# Patient Record
Sex: Female | Born: 1935 | Race: White | Hispanic: No | Smoking: Never smoker
Health system: Southern US, Community
[De-identification: ages and names within clinical notes are randomized; demographics above are authoritative.]

## PROBLEM LIST (undated history)

## (undated) DIAGNOSIS — M48062 Spinal stenosis, lumbar region with neurogenic claudication: Secondary | ICD-10-CM

## (undated) DIAGNOSIS — E785 Hyperlipidemia, unspecified: Secondary | ICD-10-CM

## (undated) DIAGNOSIS — R9431 Abnormal electrocardiogram [ECG] [EKG]: Secondary | ICD-10-CM

## (undated) DIAGNOSIS — G500 Trigeminal neuralgia: Secondary | ICD-10-CM

## (undated) DIAGNOSIS — M48061 Spinal stenosis, lumbar region without neurogenic claudication: Secondary | ICD-10-CM

## (undated) DIAGNOSIS — D696 Thrombocytopenia, unspecified: Secondary | ICD-10-CM

## (undated) DIAGNOSIS — R490 Dysphonia: Secondary | ICD-10-CM

## (undated) DIAGNOSIS — Z789 Other specified health status: Secondary | ICD-10-CM

## (undated) DIAGNOSIS — R0789 Other chest pain: Secondary | ICD-10-CM

## (undated) DIAGNOSIS — M8580 Other specified disorders of bone density and structure, unspecified site: Secondary | ICD-10-CM

## (undated) DIAGNOSIS — M5412 Radiculopathy, cervical region: Secondary | ICD-10-CM

## (undated) DIAGNOSIS — I10 Essential (primary) hypertension: Secondary | ICD-10-CM

## (undated) DIAGNOSIS — M1990 Unspecified osteoarthritis, unspecified site: Secondary | ICD-10-CM

## (undated) DIAGNOSIS — R112 Nausea with vomiting, unspecified: Secondary | ICD-10-CM

## (undated) DIAGNOSIS — D692 Other nonthrombocytopenic purpura: Secondary | ICD-10-CM

## (undated) DIAGNOSIS — Z9889 Other specified postprocedural states: Secondary | ICD-10-CM

## (undated) DIAGNOSIS — E0590 Thyrotoxicosis, unspecified without thyrotoxic crisis or storm: Secondary | ICD-10-CM

## (undated) DIAGNOSIS — R930 Abnormal findings on diagnostic imaging of skull and head, not elsewhere classified: Secondary | ICD-10-CM

## (undated) DIAGNOSIS — E538 Deficiency of other specified B group vitamins: Secondary | ICD-10-CM

## (undated) DIAGNOSIS — I4510 Unspecified right bundle-branch block: Secondary | ICD-10-CM

## (undated) DIAGNOSIS — E042 Nontoxic multinodular goiter: Secondary | ICD-10-CM

## (undated) HISTORY — DX: Deficiency of other specified B group vitamins: E53.8

## (undated) HISTORY — PX: VERTEBROPLASTY: SHX113

## (undated) HISTORY — DX: Abnormal electrocardiogram (ECG) (EKG): R94.31

## (undated) HISTORY — DX: Unspecified osteoarthritis, unspecified site: M19.90

## (undated) HISTORY — DX: Dysphonia: R49.0

## (undated) HISTORY — DX: Thrombocytopenia, unspecified: D69.6

## (undated) HISTORY — DX: Spinal stenosis, lumbar region without neurogenic claudication: M48.061

## (undated) HISTORY — DX: Hyperlipidemia, unspecified: E78.5

## (undated) HISTORY — DX: Thyrotoxicosis, unspecified without thyrotoxic crisis or storm: E05.90

## (undated) HISTORY — DX: Other nonthrombocytopenic purpura: D69.2

## (undated) HISTORY — PX: BACK SURGERY: SHX140

## (undated) HISTORY — DX: Other chest pain: R07.89

## (undated) HISTORY — DX: Radiculopathy, cervical region: M54.12

## (undated) HISTORY — DX: Essential (primary) hypertension: I10

## (undated) HISTORY — PX: CATARACT EXTRACTION W/ INTRAOCULAR LENS  IMPLANT, BILATERAL: SHX1307

## (undated) HISTORY — DX: Unspecified right bundle-branch block: I45.10

## (undated) HISTORY — DX: Abnormal findings on diagnostic imaging of skull and head, not elsewhere classified: R93.0

## (undated) HISTORY — DX: Trigeminal neuralgia: G50.0

## (undated) HISTORY — DX: Nontoxic multinodular goiter: E04.2

## (undated) HISTORY — DX: Spinal stenosis, lumbar region with neurogenic claudication: M48.062

## (undated) HISTORY — DX: Other specified disorders of bone density and structure, unspecified site: M85.80

---

## 2002-02-18 ENCOUNTER — Inpatient Hospital Stay (HOSPITAL_COMMUNITY): Admission: EM | Admit: 2002-02-18 | Discharge: 2002-02-21 | Payer: Self-pay | Admitting: Emergency Medicine

## 2002-02-18 ENCOUNTER — Encounter: Payer: Self-pay | Admitting: Emergency Medicine

## 2002-02-19 ENCOUNTER — Encounter: Payer: Self-pay | Admitting: Orthopedic Surgery

## 2002-06-19 ENCOUNTER — Encounter: Payer: Self-pay | Admitting: Orthopedic Surgery

## 2002-06-24 ENCOUNTER — Encounter: Payer: Self-pay | Admitting: Orthopedic Surgery

## 2002-06-24 ENCOUNTER — Ambulatory Visit (HOSPITAL_COMMUNITY): Admission: RE | Admit: 2002-06-24 | Discharge: 2002-06-25 | Payer: Self-pay | Admitting: Orthopedic Surgery

## 2004-06-07 ENCOUNTER — Encounter: Admission: RE | Admit: 2004-06-07 | Discharge: 2004-06-07 | Payer: Self-pay | Admitting: Orthopedic Surgery

## 2010-05-29 ENCOUNTER — Encounter: Payer: Self-pay | Admitting: Orthopedic Surgery

## 2012-09-20 ENCOUNTER — Ambulatory Visit
Admission: RE | Admit: 2012-09-20 | Discharge: 2012-09-20 | Disposition: A | Discharge status: Home / Self Care | Payer: Medicare Other | Source: Ambulatory Visit | Attending: Orthopaedic Surgery | Admitting: Orthopaedic Surgery

## 2012-09-20 ENCOUNTER — Other Ambulatory Visit: Payer: Self-pay | Admitting: Orthopaedic Surgery

## 2012-09-20 DIAGNOSIS — M545 Low back pain: Secondary | ICD-10-CM

## 2012-10-17 ENCOUNTER — Other Ambulatory Visit: Payer: Self-pay | Admitting: Orthopaedic Surgery

## 2012-10-17 DIAGNOSIS — M545 Low back pain: Secondary | ICD-10-CM

## 2012-10-17 DIAGNOSIS — M25551 Pain in right hip: Secondary | ICD-10-CM

## 2012-10-18 ENCOUNTER — Ambulatory Visit
Admission: RE | Admit: 2012-10-18 | Discharge: 2012-10-18 | Disposition: A | Discharge status: Home / Self Care | Payer: Medicare Other | Source: Ambulatory Visit | Attending: Orthopaedic Surgery | Admitting: Orthopaedic Surgery

## 2012-10-18 DIAGNOSIS — M25551 Pain in right hip: Secondary | ICD-10-CM

## 2012-11-06 ENCOUNTER — Other Ambulatory Visit (HOSPITAL_COMMUNITY): Payer: Self-pay | Admitting: Specialist

## 2012-11-06 DIAGNOSIS — R102 Pelvic and perineal pain: Secondary | ICD-10-CM

## 2012-11-06 DIAGNOSIS — M25551 Pain in right hip: Secondary | ICD-10-CM

## 2012-11-13 ENCOUNTER — Encounter (HOSPITAL_COMMUNITY): Payer: Medicare Other

## 2012-11-13 ENCOUNTER — Ambulatory Visit (HOSPITAL_COMMUNITY): Payer: Medicare Other

## 2013-02-01 ENCOUNTER — Encounter (HOSPITAL_COMMUNITY): Payer: Self-pay | Admitting: Pharmacy Technician

## 2013-02-04 ENCOUNTER — Other Ambulatory Visit (HOSPITAL_COMMUNITY): Payer: Self-pay | Admitting: Specialist

## 2013-02-06 ENCOUNTER — Encounter (HOSPITAL_COMMUNITY)
Admission: RE | Admit: 2013-02-06 | Discharge: 2013-02-06 | Disposition: A | Discharge status: Home / Self Care | Payer: Medicare Other | Source: Ambulatory Visit | Attending: Specialist | Admitting: Specialist

## 2013-02-06 ENCOUNTER — Encounter (HOSPITAL_COMMUNITY): Payer: Self-pay

## 2013-02-06 DIAGNOSIS — Z01812 Encounter for preprocedural laboratory examination: Secondary | ICD-10-CM | POA: Diagnosis not present

## 2013-02-06 DIAGNOSIS — Z79899 Other long term (current) drug therapy: Secondary | ICD-10-CM

## 2013-02-06 DIAGNOSIS — Z7982 Long term (current) use of aspirin: Secondary | ICD-10-CM | POA: Diagnosis not present

## 2013-02-06 DIAGNOSIS — M79609 Pain in unspecified limb: Secondary | ICD-10-CM | POA: Diagnosis present

## 2013-02-06 DIAGNOSIS — M48062 Spinal stenosis, lumbar region with neurogenic claudication: Secondary | ICD-10-CM | POA: Diagnosis not present

## 2013-02-06 HISTORY — DX: Other specified postprocedural states: Z98.890

## 2013-02-06 HISTORY — DX: Other specified health status: Z78.9

## 2013-02-06 HISTORY — DX: Nausea with vomiting, unspecified: R11.2

## 2013-02-06 LAB — COMPREHENSIVE METABOLIC PANEL
ALT: 14 U/L (ref 0–35)
AST: 26 U/L (ref 0–37)
Alkaline Phosphatase: 80 U/L (ref 39–117)
CO2: 28 mEq/L (ref 19–32)
Chloride: 105 mEq/L (ref 96–112)
GFR calc non Af Amer: 86 mL/min — ABNORMAL LOW (ref >90)
Potassium: 4.3 mEq/L (ref 3.5–5.1)
Sodium: 144 mEq/L (ref 135–145)
Total Bilirubin: 0.5 mg/dL (ref 0.3–1.2)
Total Protein: 7 g/dL (ref 6.0–8.3)

## 2013-02-06 LAB — CBC
MCV: 90 fL (ref 78.0–100.0)
Platelets: 160 10*3/uL (ref 150–400)
RBC: 4.4 MIL/uL (ref 3.87–5.11)
WBC: 7 10*3/uL (ref 4.0–10.5)

## 2013-02-06 NOTE — Pre-Procedure Instructions (Signed)
Maureen Jensen  02/06/2013   Your procedure is scheduled on:  Friday  02/08/13    Report to [facility]  2 * 3 at 1030 AM.  Call this number if you have problems the morning of surgery: [phone]   Remember:   Do not eat food or drink liquids after midnight.   Take these medicines the morning of surgery with A SIP OF WATER:  NONE   Do not wear jewelry, make-up or nail polish.  Do not wear lotions, powders, or perfumes. You may wear deodorant.  Do not shave 48 hours prior to surgery. Men may shave face and neck.  Do not bring valuables to the hospital.  [facility] is not responsible                  for any belongings or valuables.               Contacts, dentures or bridgework may not be worn into surgery.  Leave suitcase in the car. After surgery it may be brought to your room.  For patients admitted to the hospital, discharge time is determined by your                treatment team.               Patients discharged the day of surgery will not be allowed to drive  home.  Name and phone number of your driver:   Special Instructions: Shower using CHG 2 nights before surgery and the night before surgery.  If you shower the day of surgery use CHG.  Use special wash - you have one bottle of CHG for all showers.  You should use approximately 1/3 of the bottle for each shower.   Please read over the following fact sheets that you were given: Pain Booklet, Coughing and Deep Breathing, Blood Transfusion Information, MRSA Information and Surgical Site Infection Prevention

## 2013-02-06 NOTE — H&P (Signed)
Maureen Jensen is an 79 y.o. female.   Chief Complaint: back and right leg pain HPI: Pt with chronic and persistent pain in the back, buttocks and right leg related to stenosis of the lumbar spine.  Pain is worse with standing and ambulating and improves with sitting ,bending and stooping.  Remote history of back surgery in 1982 and history of compression fractures treated with vertebroplasty by Dr Noel Gerold.  Pt has had ESI for findings of lumbar spinal stenosis right L3-4, L4-5 and L5-S1 with temporizing relief.    Studies include MRI done at [facility] June 2013 and show lateral recess stenosis right L3-4 , L4-5 and L5-S1 with foraminal entrapment occurring at both L4-5 and L5-S1.  EMG/NCV indicated and L5 radiculopathy.  Pt wishes to proceed with surgical intervention and will undergo hemilaminectomy L3-4 and L4-5 with foraminotomy right L3, L4 and L5 using MIS technique.   Past Medical History  Diagnosis Date  . PONV (postoperative nausea and vomiting)   . Medical history non-contributory     Past Surgical History  Procedure Laterality Date  . Back surgery      1983  . Vertebroplasty    . Cataract extraction w/ intraocular lens  implant, bilateral      History reviewed. No pertinent family history. Social History:  reports that she has never smoked. She does not have any smokeless tobacco history on file. She reports that she does not drink alcohol or use illicit drugs.  Allergies: No Known Allergies  Medications Prior to Admission  Medication Sig Dispense Refill  . aspirin 81 MG tablet Take 81 mg by mouth daily.      . Cholecalciferol (VITAMIN D3) 5000 UNITS CAPS Take 1 capsule by mouth daily.      Marland Kitchen gabapentin (NEURONTIN) 100 MG capsule Take 100 mg by mouth at bedtime.        Results for orders placed during the hospital encounter of 02/06/13 (from the past 48 hour(s))  CBC     Status: None   Collection Time    02/06/13  2:20 PM      Result Value Range   WBC 7.0   4.0 - 10.5 K/uL   RBC 4.40  3.87 - 5.11 MIL/uL   Hemoglobin 13.8  12.0 - 15.0 g/dL   HCT 19.1  47.8 - 29.5 %   MCV 90.0  78.0 - 100.0 fL   MCH 31.4  26.0 - 34.0 pg   MCHC 34.8  30.0 - 36.0 g/dL   RDW 62.1  30.8 - 65.7 %   Platelets 160  150 - 400 K/uL  COMPREHENSIVE METABOLIC PANEL     Status: Abnormal   Collection Time    02/06/13  2:20 PM      Result Value Range   Sodium 144  135 - 145 mEq/L   Potassium 4.3  3.5 - 5.1 mEq/L   Chloride 105  96 - 112 mEq/L   CO2 28  19 - 32 mEq/L   Glucose, Bld 93  70 - 99 mg/dL   BUN 8  6 - 23 mg/dL   Creatinine, Ser 8.46  0.50 - 1.10 mg/dL   Calcium 9.2  8.4 - 96.2 mg/dL   Total Protein 7.0  6.0 - 8.3 g/dL   Albumin 4.2  3.5 - 5.2 g/dL   AST 26  0 - 37 U/L   ALT 14  0 - 35 U/L   Alkaline Phosphatase 80  39 - 117 U/L   Total  Bilirubin 0.5  0.3 - 1.2 mg/dL   GFR calc non Af Amer 86 (*) >90 mL/min   GFR calc Af Amer >90  >90 mL/min   Comment: (NOTE)     The eGFR has been calculated using the CKD EPI equation.     This calculation has not been validated in all clinical situations.     eGFR's persistently <90 mL/min signify possible Chronic Kidney     Disease.  SURGICAL PCR SCREEN     Status: None   Collection Time    02/06/13  2:24 PM      Result Value Range   MRSA, PCR NEGATIVE  NEGATIVE   Staphylococcus aureus NEGATIVE  NEGATIVE   Comment:            The Xpert SA Assay (FDA     approved for NASAL specimens     in patients over 59 years of age),     is one component of     a comprehensive surveillance     program.  Test performance has     been validated by [employer] for patients greater     than or equal to 22 year old.     It is not intended     to diagnose infection nor to     guide or monitor treatment.   No results found.  Review of Systems  Constitutional: Negative.   HENT: Negative.   Eyes: Negative.   Respiratory: Negative.   Cardiovascular: Negative.   Gastrointestinal: Negative.   Genitourinary: Negative.    Musculoskeletal:       Cannot stand up straight to walk.  Back pain with activity.    Skin: Negative.   Neurological: Negative.   Endo/Heme/Allergies: Negative.   Psychiatric/Behavioral: Negative.     Blood pressure 177/76, pulse 69, temperature 97.7 F (36.5 C), temperature source Oral, resp. rate 18, SpO2 100.00%. Physical Exam  Constitutional: She is oriented to person, place, and time. She appears well-developed and well-nourished.  HENT:  Head: Normocephalic and atraumatic.  Eyes: EOM are normal. Pupils are equal, round, and reactive to light.  Neck: Normal range of motion.  Cardiovascular: Normal rate, regular rhythm and normal heart sounds.   Respiratory: Effort normal and breath sounds normal.  GI: Soft.  Musculoskeletal:  -SLR bilateral LEs.  Distal pulses intact.  Pain in mid and low back with ROM.  No focal weakness of LEs  Neurological: She is alert and oriented to person, place, and time.  Skin: Skin is warm and dry.  Psychiatric: She has a normal mood and affect.     Assessment/Plan Lateral recess and foraminal stenosis right L3-4, L4-5 and L5-S1  PLAN:  Hemilaminectomy L3-4 and L4-5. Foraminotomy right L3, L4,and L5. Patient was seen and examined in the preop holding area. There has been no interval  Change in this patient's exam preop  history and physical exam  Lab tests and images have been examined and reviewed.  The Risks benefits and alternative treatments have been discussed  extensively,questions answered.  The patient has elected to undergo the discussed surgical treatment.  Jazalyn Mondor E 02/08/2013, 12:18 PM

## 2013-02-07 MED ORDER — CEFAZOLIN SODIUM-DEXTROSE 2-3 GM-% IV SOLR
2.0000 g | INTRAVENOUS | Status: AC
  Administered 2013-02-08: 2 g via INTRAVENOUS
  Filled 2013-02-07: qty 50

## 2013-02-08 ENCOUNTER — Inpatient Hospital Stay (HOSPITAL_COMMUNITY): Payer: Medicare Other | Admitting: Anesthesiology

## 2013-02-08 ENCOUNTER — Inpatient Hospital Stay (HOSPITAL_COMMUNITY): Payer: Medicare Other

## 2013-02-08 ENCOUNTER — Encounter (HOSPITAL_COMMUNITY): Payer: Self-pay | Admitting: Anesthesiology

## 2013-02-08 ENCOUNTER — Inpatient Hospital Stay (HOSPITAL_COMMUNITY)
Admission: RE | Admit: 2013-02-08 | Discharge: 2013-02-09 | DRG: 520 | Disposition: A | Discharge status: Home / Self Care | Payer: Medicare Other | Source: Ambulatory Visit | Attending: Specialist | Admitting: Specialist

## 2013-02-08 ENCOUNTER — Encounter (HOSPITAL_COMMUNITY)
Admission: RE | Disposition: A | Discharge status: Home / Self Care | Payer: Self-pay | Source: Ambulatory Visit | Attending: Specialist

## 2013-02-08 ENCOUNTER — Encounter (HOSPITAL_COMMUNITY): Payer: Self-pay | Admitting: *Deleted

## 2013-02-08 DIAGNOSIS — M48062 Spinal stenosis, lumbar region with neurogenic claudication: Secondary | ICD-10-CM

## 2013-02-08 DIAGNOSIS — Z7982 Long term (current) use of aspirin: Secondary | ICD-10-CM | POA: Diagnosis not present

## 2013-02-08 DIAGNOSIS — Z79899 Other long term (current) drug therapy: Secondary | ICD-10-CM | POA: Diagnosis not present

## 2013-02-08 DIAGNOSIS — Z01812 Encounter for preprocedural laboratory examination: Secondary | ICD-10-CM | POA: Diagnosis not present

## 2013-02-08 DIAGNOSIS — M48061 Spinal stenosis, lumbar region without neurogenic claudication: Secondary | ICD-10-CM

## 2013-02-08 DIAGNOSIS — M79609 Pain in unspecified limb: Secondary | ICD-10-CM | POA: Diagnosis present

## 2013-02-08 HISTORY — PX: LUMBAR LAMINECTOMY: SHX95

## 2013-02-08 HISTORY — DX: Spinal stenosis, lumbar region with neurogenic claudication: M48.062

## 2013-02-08 HISTORY — DX: Spinal stenosis, lumbar region without neurogenic claudication: M48.061

## 2013-02-08 SURGERY — MICRODISCECTOMY LUMBAR LAMINECTOMY
Anesthesia: General | Site: Back | Laterality: Right | Wound class: Clean

## 2013-02-08 MED ORDER — DEXAMETHASONE SODIUM PHOSPHATE 10 MG/ML IJ SOLN
10.0000 mg | Freq: Once | INTRAMUSCULAR | Status: AC
  Administered 2013-02-08: 10 mg via INTRAVENOUS

## 2013-02-08 MED ORDER — ASPIRIN 81 MG PO TABS: 81.0000 mg | ORAL_TABLET | Freq: Every day | ORAL | Status: DC

## 2013-02-08 MED ORDER — MORPHINE SULFATE 2 MG/ML IJ SOLN: 1.0000 mg | INTRAMUSCULAR | Status: DC | PRN

## 2013-02-08 MED ORDER — PHENOL 1.4 % MT LIQD: 1.0000 | OROMUCOSAL | Status: DC | PRN

## 2013-02-08 MED ORDER — LACTATED RINGERS IV SOLN
INTRAVENOUS | Status: DC
  Administered 2013-02-08: 11:00:00 via INTRAVENOUS

## 2013-02-08 MED ORDER — LACTATED RINGERS IV SOLN
INTRAVENOUS | Status: DC | PRN
  Administered 2013-02-08 (×2): via INTRAVENOUS

## 2013-02-08 MED ORDER — THROMBIN 20000 UNITS EX SOLR
CUTANEOUS | Status: AC
  Filled 2013-02-08: qty 20000

## 2013-02-08 MED ORDER — GLYCOPYRROLATE 0.2 MG/ML IJ SOLN
INTRAMUSCULAR | Status: DC | PRN
  Administered 2013-02-08: 0.4 mg via INTRAVENOUS

## 2013-02-08 MED ORDER — GABAPENTIN 100 MG PO CAPS
100.0000 mg | ORAL_CAPSULE | Freq: Every day | ORAL | Status: DC
  Administered 2013-02-08: 100 mg via ORAL
  Filled 2013-02-08 (×3): qty 1

## 2013-02-08 MED ORDER — OXYCODONE-ACETAMINOPHEN 5-325 MG PO TABS
1.0000 | ORAL_TABLET | ORAL | Status: DC | PRN
  Administered 2013-02-08 – 2013-02-09 (×2): 1 via ORAL
  Filled 2013-02-08 (×2): qty 1

## 2013-02-08 MED ORDER — BUPIVACAINE-EPINEPHRINE (PF) 0.5% -1:200000 IJ SOLN
INTRAMUSCULAR | Status: AC
  Filled 2013-02-08: qty 10

## 2013-02-08 MED ORDER — METHOCARBAMOL 500 MG PO TABS
500.0000 mg | ORAL_TABLET | Freq: Four times a day (QID) | ORAL | Status: DC | PRN
  Administered 2013-02-08 – 2013-02-09 (×2): 500 mg via ORAL
  Filled 2013-02-08 (×3): qty 1

## 2013-02-08 MED ORDER — HYDROMORPHONE HCL PF 1 MG/ML IJ SOLN: 0.2500 mg | INTRAMUSCULAR | Status: DC | PRN

## 2013-02-08 MED ORDER — OXYCODONE-ACETAMINOPHEN 5-325 MG PO TABS: 1.0000 | ORAL_TABLET | ORAL | Status: DC | PRN

## 2013-02-08 MED ORDER — THROMBIN 20000 UNITS EX SOLR
OROMUCOSAL | Status: DC | PRN
  Administered 2013-02-08: 14:00:00 via TOPICAL

## 2013-02-08 MED ORDER — FLEET ENEMA 7-19 GM/118ML RE ENEM: 1.0000 | ENEMA | Freq: Once | RECTAL | Status: AC | PRN

## 2013-02-08 MED ORDER — BISACODYL 10 MG RE SUPP: 10.0000 mg | Freq: Every day | RECTAL | Status: DC | PRN

## 2013-02-08 MED ORDER — 0.9 % SODIUM CHLORIDE (POUR BTL) OPTIME
TOPICAL | Status: DC | PRN
  Administered 2013-02-08: 1000 mL

## 2013-02-08 MED ORDER — ACETAMINOPHEN 10 MG/ML IV SOLN
INTRAVENOUS | Status: DC | PRN
  Administered 2013-02-08: 1000 mg via INTRAVENOUS

## 2013-02-08 MED ORDER — BUPIVACAINE-EPINEPHRINE 0.5% -1:200000 IJ SOLN
INTRAMUSCULAR | Status: DC | PRN
  Administered 2013-02-08: 10 mL

## 2013-02-08 MED ORDER — SODIUM CHLORIDE 0.9 % IJ SOLN: 3.0000 mL | Freq: Two times a day (BID) | INTRAMUSCULAR | Status: DC

## 2013-02-08 MED ORDER — ACETAMINOPHEN 10 MG/ML IV SOLN
INTRAVENOUS | Status: AC
  Filled 2013-02-08: qty 100

## 2013-02-08 MED ORDER — LIDOCAINE HCL (CARDIAC) 20 MG/ML IV SOLN
INTRAVENOUS | Status: DC | PRN
  Administered 2013-02-08: 50 mg via INTRAVENOUS

## 2013-02-08 MED ORDER — CEFAZOLIN SODIUM 1-5 GM-% IV SOLN
1.0000 g | Freq: Three times a day (TID) | INTRAVENOUS | Status: AC
  Administered 2013-02-08 – 2013-02-09 (×2): 1 g via INTRAVENOUS
  Filled 2013-02-08 (×2): qty 50

## 2013-02-08 MED ORDER — DOCUSATE SODIUM 100 MG PO CAPS
100.0000 mg | ORAL_CAPSULE | Freq: Two times a day (BID) | ORAL | Status: DC
  Administered 2013-02-08 – 2013-02-09 (×2): 100 mg via ORAL
  Filled 2013-02-08 (×3): qty 1

## 2013-02-08 MED ORDER — ACETAMINOPHEN 650 MG RE SUPP: 650.0000 mg | RECTAL | Status: DC | PRN

## 2013-02-08 MED ORDER — KCL IN DEXTROSE-NACL 20-5-0.45 MEQ/L-%-% IV SOLN
INTRAVENOUS | Status: DC
  Filled 2013-02-08 (×4): qty 1000

## 2013-02-08 MED ORDER — SODIUM CHLORIDE 0.9 % IV SOLN: 250.0000 mL | INTRAVENOUS | Status: DC

## 2013-02-08 MED ORDER — ONDANSETRON HCL 4 MG/2ML IJ SOLN
INTRAMUSCULAR | Status: DC | PRN
  Administered 2013-02-08: 4 mg via INTRAVENOUS

## 2013-02-08 MED ORDER — ONDANSETRON HCL 4 MG/2ML IJ SOLN: 4.0000 mg | INTRAMUSCULAR | Status: DC | PRN

## 2013-02-08 MED ORDER — METHOCARBAMOL 100 MG/ML IJ SOLN
500.0000 mg | Freq: Four times a day (QID) | INTRAVENOUS | Status: DC | PRN
  Filled 2013-02-08: qty 5

## 2013-02-08 MED ORDER — MENTHOL 3 MG MT LOZG: 1.0000 | LOZENGE | OROMUCOSAL | Status: DC | PRN

## 2013-02-08 MED ORDER — PANTOPRAZOLE SODIUM 40 MG IV SOLR
40.0000 mg | Freq: Every day | INTRAVENOUS | Status: DC
  Administered 2013-02-08: 40 mg via INTRAVENOUS
  Filled 2013-02-08 (×2): qty 40

## 2013-02-08 MED ORDER — CHLORHEXIDINE GLUCONATE 4 % EX LIQD: 60.0000 mL | Freq: Once | CUTANEOUS | Status: DC

## 2013-02-08 MED ORDER — HYDROCODONE-ACETAMINOPHEN 5-325 MG PO TABS: 1.0000 | ORAL_TABLET | ORAL | Status: DC | PRN

## 2013-02-08 MED ORDER — FENTANYL CITRATE 0.05 MG/ML IJ SOLN
INTRAMUSCULAR | Status: DC | PRN
  Administered 2013-02-08: 100 ug via INTRAVENOUS
  Administered 2013-02-08: 50 ug via INTRAVENOUS
  Administered 2013-02-08: 100 ug via INTRAVENOUS

## 2013-02-08 MED ORDER — ALUM & MAG HYDROXIDE-SIMETH 200-200-20 MG/5ML PO SUSP: 30.0000 mL | Freq: Four times a day (QID) | ORAL | Status: DC | PRN

## 2013-02-08 MED ORDER — MIDAZOLAM HCL 5 MG/5ML IJ SOLN
INTRAMUSCULAR | Status: DC | PRN
  Administered 2013-02-08: 1 mg via INTRAVENOUS

## 2013-02-08 MED ORDER — ACETAMINOPHEN 325 MG PO TABS: 650.0000 mg | ORAL_TABLET | ORAL | Status: DC | PRN

## 2013-02-08 MED ORDER — ROCURONIUM BROMIDE 100 MG/10ML IV SOLN
INTRAVENOUS | Status: DC | PRN
  Administered 2013-02-08: 50 mg via INTRAVENOUS

## 2013-02-08 MED ORDER — SENNOSIDES-DOCUSATE SODIUM 8.6-50 MG PO TABS: 1.0000 | ORAL_TABLET | Freq: Every evening | ORAL | Status: DC | PRN

## 2013-02-08 MED ORDER — KETOROLAC TROMETHAMINE 30 MG/ML IJ SOLN
15.0000 mg | Freq: Three times a day (TID) | INTRAMUSCULAR | Status: DC
  Administered 2013-02-08 – 2013-02-09 (×2): 15 mg via INTRAVENOUS
  Filled 2013-02-08 (×3): qty 1

## 2013-02-08 MED ORDER — SODIUM CHLORIDE 0.9 % IJ SOLN: 3.0000 mL | INTRAMUSCULAR | Status: DC | PRN

## 2013-02-08 MED ORDER — METHOCARBAMOL 500 MG PO TABS: 500.0000 mg | ORAL_TABLET | Freq: Four times a day (QID) | ORAL | Status: DC | PRN

## 2013-02-08 MED ORDER — VITAMIN D3 25 MCG (1000 UNIT) PO TABS
5000.0000 [IU] | ORAL_TABLET | Freq: Every day | ORAL | Status: DC
  Administered 2013-02-09: 5000 [IU] via ORAL
  Filled 2013-02-08: qty 5

## 2013-02-08 MED ORDER — PROPOFOL 10 MG/ML IV BOLUS
INTRAVENOUS | Status: DC | PRN
  Administered 2013-02-08: 100 mg via INTRAVENOUS

## 2013-02-08 MED ORDER — ZOLPIDEM TARTRATE 5 MG PO TABS: 5.0000 mg | ORAL_TABLET | Freq: Every evening | ORAL | Status: DC | PRN

## 2013-02-08 MED ORDER — ASPIRIN EC 81 MG PO TBEC
81.0000 mg | DELAYED_RELEASE_TABLET | Freq: Every day | ORAL | Status: DC
  Administered 2013-02-09: 81 mg via ORAL
  Filled 2013-02-08: qty 1

## 2013-02-08 MED ORDER — NEOSTIGMINE METHYLSULFATE 1 MG/ML IJ SOLN
INTRAMUSCULAR | Status: DC | PRN
  Administered 2013-02-08: 3 mg via INTRAVENOUS

## 2013-02-08 MED ORDER — VITAMIN D3 125 MCG (5000 UT) PO CAPS: 1.0000 | ORAL_CAPSULE | Freq: Every day | ORAL | Status: DC

## 2013-02-08 SURGICAL SUPPLY — 54 items
BUR RND FLUTED 2.5 (BURR) IMPLANT
BUR ROUND FLUTED 4 SOFT TCH (BURR) IMPLANT
BUR SABER RD CUTTING 3.0 (BURR) ×2 IMPLANT
CANISTER SUCTION 2500CC (MISCELLANEOUS) ×2 IMPLANT
CLOTH BEACON ORANGE TIMEOUT ST (SAFETY) ×2 IMPLANT
CORDS BIPOLAR (ELECTRODE) ×2 IMPLANT
COVER MAYO STAND STRL (DRAPES) ×2 IMPLANT
COVER SURGICAL LIGHT HANDLE (MISCELLANEOUS) ×2 IMPLANT
DERMABOND ADVANCED (GAUZE/BANDAGES/DRESSINGS) ×1
DERMABOND ADVANCED .7 DNX12 (GAUZE/BANDAGES/DRESSINGS) ×1 IMPLANT
DRAPE C-ARM 42X72 X-RAY (DRAPES) ×2 IMPLANT
DRAPE MICROSCOPE LEICA (MISCELLANEOUS) ×2 IMPLANT
DRAPE POUCH INSTRU U-SHP 10X18 (DRAPES) ×2 IMPLANT
DRAPE PROXIMA HALF (DRAPES) ×2 IMPLANT
DRAPE SURG 17X23 STRL (DRAPES) ×8 IMPLANT
DRSG MEPILEX BORDER 4X4 (GAUZE/BANDAGES/DRESSINGS) ×2 IMPLANT
DRSG MEPILEX BORDER 4X8 (GAUZE/BANDAGES/DRESSINGS) IMPLANT
DURAPREP 26ML APPLICATOR (WOUND CARE) ×2 IMPLANT
ELECT BLADE 4.0 EZ CLEAN MEGAD (MISCELLANEOUS) ×2
ELECT CAUTERY BLADE 6.4 (BLADE) ×2 IMPLANT
ELECT REM PT RETURN 9FT ADLT (ELECTROSURGICAL) ×2
ELECTRODE BLDE 4.0 EZ CLN MEGD (MISCELLANEOUS) ×1 IMPLANT
ELECTRODE REM PT RTRN 9FT ADLT (ELECTROSURGICAL) ×1 IMPLANT
GLOVE BIOGEL PI IND STRL 7.5 (GLOVE) ×1 IMPLANT
GLOVE BIOGEL PI INDICATOR 7.5 (GLOVE) ×1
GLOVE ECLIPSE 7.0 STRL STRAW (GLOVE) ×2 IMPLANT
GLOVE ECLIPSE 8.5 STRL (GLOVE) ×2 IMPLANT
GLOVE SURG 8.5 LATEX PF (GLOVE) ×2 IMPLANT
GOWN PREVENTION PLUS LG XLONG (DISPOSABLE) ×2 IMPLANT
GOWN PREVENTION PLUS XXLARGE (GOWN DISPOSABLE) ×2 IMPLANT
GOWN STRL NON-REIN LRG LVL3 (GOWN DISPOSABLE) ×4 IMPLANT
KIT BASIN OR (CUSTOM PROCEDURE TRAY) ×2 IMPLANT
KIT ROOM TURNOVER OR (KITS) ×2 IMPLANT
NEEDLE 22X1 1/2 (OR ONLY) (NEEDLE) ×2 IMPLANT
NEEDLE SPNL 18GX3.5 QUINCKE PK (NEEDLE) ×4 IMPLANT
NS IRRIG 1000ML POUR BTL (IV SOLUTION) ×2 IMPLANT
PACK LAMINECTOMY ORTHO (CUSTOM PROCEDURE TRAY) ×2 IMPLANT
PAD ARMBOARD 7.5X6 YLW CONV (MISCELLANEOUS) ×4 IMPLANT
PATTIES SURGICAL .5 X.5 (GAUZE/BANDAGES/DRESSINGS) ×2 IMPLANT
PATTIES SURGICAL .5 X1 (DISPOSABLE) ×2 IMPLANT
PATTIES SURGICAL .75X.75 (GAUZE/BANDAGES/DRESSINGS) IMPLANT
SPONGE LAP 4X18 X RAY DECT (DISPOSABLE) ×4 IMPLANT
SPONGE SURGIFOAM ABS GEL 100 (HEMOSTASIS) ×2 IMPLANT
SUT VIC AB 1 CT1 27 (SUTURE)
SUT VIC AB 1 CT1 27XBRD ANBCTR (SUTURE) IMPLANT
SUT VIC AB 2-0 CT1 27 (SUTURE) ×1
SUT VIC AB 2-0 CT1 TAPERPNT 27 (SUTURE) ×1 IMPLANT
SUT VICRYL 0 UR6 27IN ABS (SUTURE) ×2 IMPLANT
SUT VICRYL 4-0 PS2 18IN ABS (SUTURE) ×2 IMPLANT
SYR CONTROL 10ML LL (SYRINGE) ×2 IMPLANT
TOWEL OR 17X24 6PK STRL BLUE (TOWEL DISPOSABLE) ×2 IMPLANT
TOWEL OR 17X26 10 PK STRL BLUE (TOWEL DISPOSABLE) ×2 IMPLANT
TRAY FOLEY CATH 16FRSI W/METER (SET/KITS/TRAYS/PACK) ×2 IMPLANT
WATER STERILE IRR 1000ML POUR (IV SOLUTION) IMPLANT

## 2013-02-08 NOTE — Transfer of Care (Signed)
Immediate Anesthesia Transfer of Care Note  Patient: Maureen Jensen  Procedure(s) Performed: Procedure(s): Right L3-4, L4-5 Hemi-laminectomy, decompression right L3,L4 and L5 nerve roots (Right)  Patient Location: PACU  Anesthesia Type:General  Level of Consciousness: sedated, patient cooperative and responds to stimulation  Airway & Oxygen Therapy: Patient Spontanous Breathing and Patient connected to nasal cannula oxygen  Post-op Assessment: Report given to PACU RN, Post -op Vital signs reviewed and stable and Patient moving all extremities  Post vital signs: Reviewed and stable  Complications: No apparent anesthesia complications

## 2013-02-08 NOTE — Op Note (Signed)
02/08/2013  2:52 PM  PATIENT:  Maureen Jensen  79 y.o. female  MRN: [id number]  OPERATIVE REPORT  PRE-OPERATIVE DIAGNOSIS:  Right L3-4, L4-5, L5-S1 lateral recess and foraminal stenosis  POST-OPERATIVE DIAGNOSIS:  Right L3-4, L4-5, L5-S1 lateral recess and foraminal stenosis  PROCEDURE:  Procedure(s): Right L3-4, L4-5 Hemi-laminectomy, decompression right L3,L4 and L5 nerve roots MIS with microscope.    SURGEON:  Kerrin Champagne, MD     ASSISTANT:  Maud Deed, PA-C  (Present throughout the entire procedure and necessary for completion of procedure in a timely manner)     ANESTHESIA:  General,supplemented with local anesthesia marcaine 1/2% with 1/200,000 epinephrine.    COMPLICATIONS:  None.     DRAINS: Foley to SD.  EBL: 50cc  PROCEDURE:The patient was met in the holding area, and the appropriate Right Lumbar level L3-4 and L4-5 identified and marked with "x" and my initials.The patient was then transported to OR and was placed under general anesthesia without difficulty. The patient received appropriate preoperative antibiotic prophylaxis. Foley catheter was placed sterilely. The patient after intubation atraumatically was transferred to the operating room table, prone position, Wilson frame, sliding OR table. All pressure points were well padded. The arms in 90-90 well-padded at the elbows. Standard prep with DuraPrep solution lower dorsal spine to the mid sacral segment. Draped in the usual manner iodine Vi-Drape was used. Time-out procedure was called and correct.  Skin  was then infiltrated with Marcaine half percent with 1-200,000 epinephrine total of 10 cc used. Incision was made at the expected L4 level and extended superiorly ellipsing the old incision scar. An incision approximately an inch inch and a half in length was then made through skin and subcutaneous layers in line with the right side of the expected midline.  An incision made into the right lumbosacral fascia  approximately an inch in length . The subcutaneous layers divided and the lumbodorsal fascia incised along the right side of the spinous processes L4 and L3.Cobb elevator used to perform subperioteal exposure of the posterior elements L3 and L4. Successive dilators were then carried up to the 11 mm size. The depth measured off of the dilators at about 60 mm and 60 mm retractors and placed on the scaffolding for the MIS equipment and guided over dilators down to and docking on the posterior aspect of the lamina at the expected L3-4 level. This was sterilely attached to the articulating arm and it's up right which had been attached the OR table sterilely. C-arm fluoroscopy was identified the dilators and the retractors at the appropriate level L3-4.Mervyn Skeeters Penfield #4 inserted between the spinous process of L3 and L4.  C-arm was draped sterilely to the field and used to identify the position of the Penfield#4, it was noted between the spinous process of L3 and that of L4. The operating room microscope sterilely draped brought into the field. Under the operating room microscope, the L4-5 interspace carefully debrided the small amount of muscle attachment here and high-speed bur used to drill the medial aspect of the inferior articular process of L4 approximately 10%. A localization lateral C-arm view was obtained with Penfield 4 in the L4-5 facet. 2 mm Kerrison then used to enter the spinal canal over the superior aspect of the L5 lamina carefully using the Kerrison to debris the attachment as a curet. Foraminotomy was then performed over the L5 nerve root. The medial 10% superior articular process of L5 and then resected using an osteotome and 2 mm  Kerrison. This allowed for identification of the thecal sac. Penfield 4 was then used to carefully mobilize the thecal sac medially and the L5 nerve root identified within the lateral recess flattened due to hypertrophic ligmentum flavum and the enlarged medial  L4-5 facet.  Carefully the lateral aspect of the L5 nerve root was identified and a Penfield 4 was used to mobilize the nerve medially and retracted using a Derricho retractor. Further foraminotomies was performed over the L4 nerve root the nerve root was noted to be decompressed. The nerve root able to be retracted along the medial aspect of the L5 pedicle and the lateral recess at L4-5 was decompressed with 2 and 3 mm kerrison rongeurs. The complete right hemilaminectomy was carried out at L4 and the ligamentum flavum at L3-4 encountered. Ligamentum flavum was further debrided superiorly to the level L3-4 disc.  Ligamentum flavum was debrided and lateral recess along the medial aspect L3-4 facet. Ball tip nerve probe was then able to carefully palpate the neuroforamen for L4 and L5 finding these to be well decompressed. Attention then turned to the right L3-4 level which was easily visualized with the microscope. Soft tissues debrided about the posterior aspect of the L3-4 interspace. High-speed bur and then used to carefully drill inferior 3 or 4 mm of the right side L3 lamina and on the medial aspect of the right L3 inferior articular process of 3 mm.  Ligamentum flavum then debrided with the 2 mm and 3 mm Kerrisons we decompressed the L4 nerve root and the lateral recess right L3-4 decompressed using 2 and 3 mm Kerrisons sizing hypertrophic reflected ligamentum flavum extending superiorly. Ligamentum flavum was resected off the ventral aspect of the inferior margin of the L3 lamina. Hockey-stick nerve probe could then be passed out the L4 neuroforamen and the right sided hemilaminectomy was carried up to the right L2-3 level. The ligamentum flavum at L2-3 was debride from the medial aspect of the L2-3 facet.  Venous bleeding encountered. Thrombin-soaked Gelfoam used to control this following this then the sac and the L5 nerve root were mobilized medially. Irrigation was carried out down to this bleeding controlled with  Gelfoam. Gelfoam was then removed. Irrigation carried careful examination demonstrated no active bleeding present. Retractors were then carefully removed Since carefully then the Bleeding was then controlled using thrombin-soaked Gelfoam small cottonoids. Small amount of bleeding within the soft tissue mass the laminotomy area was controlled using bipolar electrocautery. Irrigation was carried out using copious amounts of irrigant solution. All Gelfoam were then removed. No significant active bleeding present at the time of removal. All instruments sponge counts were correct traction system was then carefully removed carefully rotating retractors with this withdrawal and only bipolar electrocautery of any small bleeders. Lumbodorsal fascia was then carefully approximated with interrupted 0 Vicryl sutures, UR 6 needle deep subcutaneous layers were approximated with interrupted 0 Vicryl sutures on UR 6 the appear subcutaneous layers approximated with interrupted 2-0 Vicryl sutures and the skin closed with a running subcutaneous stitch of 4-0 Vicryl. Dermabond was applied allowed to dry and then Mepilex bandage applied. Patient was then carefully returned to supine position on a stretcher, reactivated and extubated. He was then returned to recovery room in satisfactory condition.  Maud Deed PA-C perform the duties of assistant surgeon during this case. She was present from the beginning of the case to the end of the case assisting in transfer the patient from his stretcher to the OR table and back to the stretcher  at the end of the case. Assisted in careful retraction and suction of the laminectomy site delicate neural structures operating under the operating room microscope. She performed closure of the incision from the fascia to the skin applying the dressing.     NITKA,JAMES E  02/08/2013, 2:52 PM

## 2013-02-08 NOTE — OR Nursing (Signed)
At pre-op assessment, pt. Denied numbness or weakness anywhere but complained of right hip pain and inability to stand up straight without sharp pain.

## 2013-02-08 NOTE — Anesthesia Postprocedure Evaluation (Signed)
  Anesthesia Post-op Note  Patient: Maureen Jensen  Procedure(s) Performed: Procedure(s): Right L3-4, L4-5 Hemi-laminectomy, decompression right L3,L4 and L5 nerve roots (Right)  Patient Location: PACU  Anesthesia Type:General  Level of Consciousness: awake and alert   Airway and Oxygen Therapy: Patient Spontanous Breathing  Post-op Pain: none  Post-op Assessment: Post-op Vital signs reviewed, Patient's Cardiovascular Status Stable, Respiratory Function Stable, Patent Airway and No signs of Nausea or vomiting  Post-op Vital Signs: Reviewed and stable  Complications: No apparent anesthesia complications

## 2013-02-08 NOTE — Anesthesia Preprocedure Evaluation (Addendum)
Anesthesia Evaluation  Patient identified by MRN, date of birth, ID band  Reviewed: Allergy & Precautions, H&P , NPO status , Patient's Chart, lab work & pertinent test results  History of Anesthesia Complications (+) PONV  Airway Mallampati: II TM Distance: >3 FB Neck ROM: Full    Dental no notable dental hx. (+) Teeth Intact and Dental Advisory Given   Pulmonary neg pulmonary ROS,  breath sounds clear to auscultation  Pulmonary exam normal       Cardiovascular negative cardio ROS  Rhythm:Regular Rate:Normal     Neuro/Psych negative neurological ROS  negative psych ROS   GI/Hepatic negative GI ROS, Neg liver ROS,   Endo/Other  negative endocrine ROS  Renal/GU negative Renal ROS  negative genitourinary   Musculoskeletal   Abdominal   Peds  Hematology negative hematology ROS (+)   Anesthesia Other Findings   Reproductive/Obstetrics negative OB ROS                          Anesthesia Physical Anesthesia Plan  ASA: I  Anesthesia Plan: General   Post-op Pain Management:    Induction: Intravenous  Airway Management Planned: Oral ETT  Additional Equipment:   Intra-op Plan:   Post-operative Plan: Extubation in OR  Informed Consent: I have reviewed the patients History and Physical, chart, labs and discussed the procedure including the risks, benefits and alternatives for the proposed anesthesia with the patient or authorized representative who has indicated his/her understanding and acceptance.   Dental advisory given  Plan Discussed with: CRNA  Anesthesia Plan Comments:         Anesthesia Quick Evaluation

## 2013-02-08 NOTE — Brief Op Note (Signed)
02/08/2013  2:49 PM  PATIENT:  Maureen Jensen  79 y.o. female  PRE-OPERATIVE DIAGNOSIS:  Right L3-4, L4-5, L5-S1 lateral recess and foraminal stenosis  POST-OPERATIVE DIAGNOSIS:  Right L3-4, L4-5, L5-S1 lateral recess and foraminal stenosis  PROCEDURE:  Procedure(s): Right L3-4, L4-5 Hemi-laminectomy, decompression right L3,L4 and L5 nerve roots (Right)  SURGEON:  Surgeon(s) and Role: Kerrin Champagne, MD - Primary  PHYSICIAN ASSISTANT: Maud Deed, PA-C  ANESTHESIA:   general, Dr. Sampson Goon.  EBL:  Total I/O In: 1300 [I.V.:1300] Out: 250 [Urine:150; Blood:100]  BLOOD ADMINISTERED:none  DRAINS: Urinary Catheter (Foley)   LOCAL MEDICATIONS USED:  MARCAINE    and Amount: 10 ml  SPECIMEN:  No Specimen  DISPOSITION OF SPECIMEN:  N/A  COUNTS:  YES  TOURNIQUET:  * No tourniquets in log *  DICTATION: .Dragon Dictation  PLAN OF CARE: Admit to inpatient   PATIENT DISPOSITION:  PACU - hemodynamically stable.   Delay start of Pharmacological VTE agent (>24hrs) due to surgical blood loss or risk of bleeding: yes

## 2013-02-09 DIAGNOSIS — M48062 Spinal stenosis, lumbar region with neurogenic claudication: Secondary | ICD-10-CM | POA: Diagnosis not present

## 2013-02-09 DIAGNOSIS — M79609 Pain in unspecified limb: Secondary | ICD-10-CM | POA: Diagnosis not present

## 2013-02-09 MED ORDER — PANTOPRAZOLE SODIUM 40 MG PO TBEC: 40.0000 mg | DELAYED_RELEASE_TABLET | Freq: Every day | ORAL | Status: DC

## 2013-02-09 NOTE — Care Management Note (Signed)
    Page 1 of 1   02/09/2013     4:52:12 PM   CARE MANAGEMENT NOTE 02/09/2013  Patient:  Maureen Jensen, Maureen Jensen   Account Number:  [id number]  Date Initiated:  02/09/2013  Documentation initiated by:  [facility]  Subjective/Objective Assessment:   adm: R hip , buttocks and LE pain     Action/Plan:   discharge planning   Anticipated DC Date:  02/09/2013   Anticipated DC Plan:  HOME/SELF CARE         Choice offered to / List presented to:             Status of service:  Completed, signed off Medicare Important Message given?   (If response is "NO", the following Medicare IM given date fields will be blank) Date Medicare IM given:   Date Additional Medicare IM given:    Discharge Disposition:  HOME/SELF CARE  Per UR Regulation:    If discussed at Long Length of Stay Meetings, dates discussed:    Comments:  02/09/13 CM followup to pt discharge to home with self care as she declines PT recc. for HHPT and RN for eval.  No other CM needs were communicated.  Freddy Jaksch, BSN, CM [phone].

## 2013-02-09 NOTE — Evaluation (Addendum)
Physical Therapy Evaluation  (One Time Eval and DC) Patient Details Name: Maureen Jensen MRN: [id number] DOB: Sep 19, 1933 Today's Date: 02/09/2013 Time: 1215-1250 PT Time Calculation (min): 35 min  PT Assessment / Plan / Recommendation History of Present Illness  Pt. was admitted with history of R hip , buttocks and LE pain.  She underwent L 3-4 and 4-5 R hemilaminectomies with decompression.  Clinical Impression  Pt. Presents to PT with initial decrease in walking stability which improved greatly throughout session, to the point that she walked on the unit without device and no overt LOB noted.  Discussed with pt. That it is recommended that she use her RW initially upon DC however, she declines to do so due to limited space in her home.  Also recommended to pt. That she have HHPT home safety eval which she also politely declines.  She received back education/precautions and will have 24 hour care by her report.  Will sign off as she has DC home orders and her DC is pending.  No goals set.  Of note, pt. States her R hip pain is no longer present and she is quite pleased with this.    PT Assessment  All further PT needs can be met in the next venue of care    Follow Up Recommendations  Home health PT;Supervision/Assistance - 24 hour;Supervision for mobility/OOB;Other (comment) (pt. has politely declined home safety eval)    Does the patient have the potential to tolerate intense rehabilitation      Barriers to Discharge        Equipment Recommendations  None recommended by PT;Other (comment) (pt. has RW in the home)    Recommendations for Other Services     Frequency      Precautions / Restrictions Precautions Precautions: Back Precaution Booklet Issued: Yes (comment) Precaution Comments: pt  educated on 3/3 back precautions and log rolling technique and was given handout Restrictions Weight Bearing Restrictions: No   Pertinent Vitals/Pain See vitals tab       Mobility   Bed Mobility Bed Mobility: Rolling Left;Left Sidelying to Sit;Sitting - Scoot to [employer] of Bed Rolling Left: 6: Modified independent (Device/Increase time) Left Sidelying to Sit: 6: Modified independent (Device/Increase time) Sitting - Scoot to Edge of Bed: 6: Modified independent (Device/Increase time) Details for Bed Mobility Assistance: pt. needed technique cueing and increased time but able to manage mobility on her own. Transfers Transfers: Sit to Stand;Stand to Sit Sit to Stand: 6: Modified independent (Device/Increase time);From bed;With upper extremity assist Stand to Sit: 6: Modified independent (Device/Increase time);With upper extremity assist;With armrests;To chair/3-in-1 Details for Transfer Assistance: vc's for hand placement and technique. Pt. needed increased time but managed on her own Ambulation/Gait Ambulation/Gait Assistance: 4: Min guard;6: Modified independent (Device/Increase time) Ambulation Distance (Feet): 400 Feet Assistive device: Rolling walker;None Ambulation/Gait Assistance Details: Pt. initially somewhat unsteady on feet, needing min guard assist for safety and with RW.  Pt. progressed to mod I gait without device and no overt LOB noted.  Pt. did initially have tendance to "furniture walk" for added stability but she says this is because she has been in the bed since yesterday.  She is not amenable to using RW in the home as she says her space is quite limited.   Gait Pattern: Step-through pattern Stairs: Yes Stairs Assistance: 4: Min guard Stair Management Technique: No rails;Forwards Number of Stairs: 5    Exercises     PT Diagnosis: Difficulty walking;Acute pain  PT Problem List: Decreased  activity tolerance;Decreased mobility;Decreased knowledge of use of DME;Decreased knowledge of precautions;Pain PT Treatment Interventions:       PT Goals(Current goals can be found in the care plan section) Acute Rehab PT Goals Patient Stated Goal: home with family  support  Visit Information  Last PT Received On: 02/09/13 Assistance Needed: +1 History of Present Illness: Pt. was admitted with history of R hip , buttocks and LE pain.  She underwent L 3-4 and 4-5 R hemilaminectomies with decompression.       Prior Functioning  Home Living Family/patient expects to be discharged to:: Private residence Living Arrangements: Alone Available Help at Discharge: Available 24 hours/day;Family Type of Home: House Home Access: Stairs to enter [employer] of Steps: 1 Entrance Stairs-Rails: None Home Layout: Two level;Laundry or work area in basement;Able to live on main level with bedroom/bathroom Home Equipment: Environmental consultant - 2 wheels Prior Function Level of Independence: Independent Communication Communication: No difficulties    Cognition  Cognition Arousal/Alertness: Awake/alert Behavior During Therapy: WFL for tasks assessed/performed Overall Cognitive Status: Within Functional Limits for tasks assessed    Extremity/Trunk Assessment Upper Extremity Assessment Upper Extremity Assessment: Overall WFL for tasks assessed Lower Extremity Assessment Lower Extremity Assessment: Overall WFL for tasks assessed Cervical / Trunk Assessment Cervical / Trunk Assessment: Normal   Balance Balance Balance Assessed: Yes Dynamic Standing Balance Dynamic Standing - Balance Support: No upper extremity supported;During functional activity Dynamic Standing - Level of Assistance: 6: Modified independent (Device/Increase time)  End of Session PT - End of Session Equipment Utilized During Treatment: Gait belt Activity Tolerance: Patient tolerated treatment well Patient left: in chair;with call bell/phone within reach Nurse Communication: Mobility status;Precautions;Other (comment) (pt. declines HHPT safety eval and use of RW)  GP     Ferman Hamming 02/09/2013, 1:08 PM Weldon Picking PT Acute Rehab Services [phone] Beeper [phone]

## 2013-02-09 NOTE — Progress Notes (Signed)
Subjective: Pt feels better - has not been up oob yet   Objective: Vital signs in last 24 hours: Temp:  [96.3 F (35.7 C)-98.7 F (37.1 C)] 98.7 F (37.1 C) (10/04 0220) Pulse Rate:  [64-86] 77 (10/04 0220) Resp:  [13-18] 16 (10/04 0220) BP: (105-177)/(54-76) 105/58 mmHg (10/04 0220) SpO2:  [93 %-100 %] 96 % (10/04 0220) Weight:  [63.504 kg (140 lb)] 63.504 kg (140 lb) (10/03 1730)  Intake/Output from previous day: 10/03 0701 - 10/04 0700 In: 2290 [P.O.:240; I.V.:2050] Out: 1475 [Urine:1375; Blood:100] Intake/Output this shift:    Exam:  Intact pulses distally Dorsiflexion/Plantar flexion intact  Labs:  Recent Labs  02/06/13 1420  HGB 13.8    Recent Labs  02/06/13 1420  WBC 7.0  RBC 4.40  HCT 39.6  PLT 160    Recent Labs  02/06/13 1420  NA 144  K 4.3  CL 105  CO2 28  BUN 8  CREATININE 0.60  GLUCOSE 93  CALCIUM 9.2   No results found for this basename: LABPT, INR,  in the last 72 hours  Assessment/Plan: Possible dc today if pt does well oob/ otherwise will dc am - all material ready for dc   Erna Brossard SCOTT 02/09/2013, 7:57 AM

## 2013-02-09 NOTE — Evaluation (Signed)
Occupational Therapy Evaluation Patient Details Name: Maureen Jensen MRN: [id number] DOB: 12-03-1933 Today's Date: 02/09/2013 Time: 1191-4782 OT Time Calculation (min): 14 min  OT Assessment / Plan / Recommendation History of present illness Pt. was admitted with history of R hip , buttocks and LE pain.  She underwent L 3-4 and 4-5 R hemilaminectomies with decompression.   Clinical Impression   Pt admitted with above. Education completed. Pt has no DME needs. Pt reports she has 24/7 supervision/assist at home.  Pt at overall supervision level with ADLs and has no further acute OT needs. Anticipate d/c home today.   OT Assessment  Patient does not need any further OT services    Follow Up Recommendations  No OT follow up;Supervision/Assistance - 24 hour    Barriers to Discharge      Equipment Recommendations  None recommended by OT    Recommendations for Other Services    Frequency       Precautions / Restrictions Precautions Precautions: Back Precaution Booklet Issued: Yes (comment) Precaution Comments: Educated pt on 3/3 back precautions. Restrictions Weight Bearing Restrictions: No   Pertinent Vitals/Pain See vitals    ADL  Grooming: Performed;Wash/dry hands;Brushing hair;Supervision/safety Where Assessed - Grooming: Unsupported standing Upper Body Bathing: Simulated;Supervision/safety Where Assessed - Upper Body Bathing: Unsupported sitting Lower Body Bathing: Simulated;Supervision/safety Where Assessed - Lower Body Bathing: Unsupported sit to stand Upper Body Dressing: Performed;Supervision/safety Where Assessed - Upper Body Dressing: Unsupported sitting Lower Body Dressing: Performed;Supervision/safety Where Assessed - Lower Body Dressing: Unsupported sit to stand Toilet Transfer: Simulated;Supervision/safety Toilet Transfer Method: Sit to [employer]:  (simulated at [employer]) Equipment Used: Gait belt;Rolling walker Transfers/Ambulation  Related to ADLs: supervision with RW ADL Comments: Pt retrieved clothing at supervision level and draped clothes over RW while transporting them back to chair.  Pt performed full dressing ADL at supervision level (supervision for safety and to ensure adherance to back precautions). Pt able to cross ankles over knees in order to thread LEs through pants and undergarment.    OT Diagnosis:    OT Problem List:   OT Treatment Interventions:     OT Goals(Current goals can be found in the care plan section) Acute Rehab OT Goals Patient Stated Goal: home with family support  Visit Information  Last OT Received On: 02/09/13 Assistance Needed: +1 History of Present Illness: Pt. was admitted with history of R hip , buttocks and LE pain.  She underwent L 3-4 and 4-5 R hemilaminectomies with decompression.       Prior Functioning     Home Living Family/patient expects to be discharged to:: Private residence Living Arrangements: Alone Available Help at Discharge: Available 24 hours/day;Family Type of Home: House Home Access: Stairs to enter [employer] of Steps: 1 Entrance Stairs-Rails: None Home Layout: Two level;Laundry or work area in basement;Able to live on main level with bedroom/bathroom Home Equipment: Environmental consultant - 2 wheels Prior Function Level of Independence: Independent Communication Communication: No difficulties         Vision/Perception     Cognition  Cognition Arousal/Alertness: Awake/alert Behavior During Therapy: WFL for tasks assessed/performed Overall Cognitive Status: Within Functional Limits for tasks assessed    Extremity/Trunk Assessment Upper Extremity Assessment Upper Extremity Assessment: Overall WFL for tasks assessed Lower Extremity Assessment Lower Extremity Assessment: Overall WFL for tasks assessed Cervical / Trunk Assessment Cervical / Trunk Assessment: Normal     Mobility Bed Mobility Bed Mobility: Not assessed Rolling Left: 6:  Modified independent (Device/Increase time) Left Sidelying to  Sit: 6: Modified independent (Device/Increase time) Sitting - Scoot to Edge of Bed: 6: Modified independent (Device/Increase time) Details for Bed Mobility Assistance: pt. needed technique cueing and increased time but able to manage mobility on her own. Transfers Transfers: Sit to Stand;Stand to Sit Sit to Stand: 5: Supervision;From chair/3-in-1 Stand to Sit: 5: Supervision;To chair/3-in-1 Details for Transfer Assistance: vc's for hand placement and technique. Pt. needed increased time but managed on her own     Exercise     Balance Balance Balance Assessed: Yes Dynamic Standing Balance Dynamic Standing - Balance Support: No upper extremity supported;During functional activity Dynamic Standing - Level of Assistance: 6: Modified independent (Device/Increase time)   End of Session OT - End of Session Equipment Utilized During Treatment: Gait belt;Rolling walker Activity Tolerance: Patient tolerated treatment well Patient left: in chair;with call bell/phone within reach Nurse Communication: Mobility status  GO   02/09/2013 Cipriano Mile OTR/L Pager [phone] Office [phone]   Cipriano Mile 02/09/2013, 2:10 PM

## 2013-02-12 ENCOUNTER — Encounter (HOSPITAL_COMMUNITY): Payer: Self-pay | Admitting: Specialist

## 2013-03-05 NOTE — Discharge Summary (Signed)
Physician Discharge Summary  Patient ID: Maureen Jensen MRN: [id number] DOB/AGE: Jul 06, 1933 79 y.o.  Admit date: February 10, 2013 Discharge date: 02/09/2013  Admission Diagnoses:  Spinal stenosis, lumbar region, with neurogenic claudication Right L3-4, L4-5, L5-S1 lateral recess and foraminal stenosis  Discharge Diagnoses:  Principal Problem:   Spinal stenosis, lumbar region, with neurogenic claudication SAME  Past Medical History  Diagnosis Date  . PONV (postoperative nausea and vomiting)   . Medical history non-contributory     Surgeries: Procedure(s): Right L3-4, L4-5 Hemi-laminectomy, decompression right L3,L4 and L5 nerve roots on 02-10-2013   Consultants (if any):  NONE  Discharged Condition: Improved  Hospital Course: Maureen Jensen is an 79 y.o. female who was admitted 02/10/2013 with a diagnosis of Spinal stenosis, lumbar region, with neurogenic claudication and went to the operating room on 2013-02-10 and underwent the above named procedures.    She was given perioperative antibiotics:  Anti-infectives   Start     Dose/Rate Route Frequency Ordered Stop   Feb 10, 2013 2000  ceFAZolin (ANCEF) IVPB 1 g/50 mL premix     1 g 100 mL/hr over 30 Minutes Intravenous Every 8 hours 10-Feb-2013 1730 02/09/13 0430   Feb 10, 2013 0600  ceFAZolin (ANCEF) IVPB 2 g/50 mL premix     2 g 100 mL/hr over 30 Minutes Intravenous On call to O.R. 02/07/13 1420 Feb 10, 2013 1235    .  She was given sequential compression devices, early ambulation for DVT prophylaxis.  She benefited maximally from the hospital stay and there were no complications.    Recent vital signs:  Filed Vitals:   02/09/13 0220  BP: 105/58  Pulse: 77  Temp: 98.7 F (37.1 C)  Resp: 16    Recent laboratory studies:  Lab Results  Component Value Date   HGB 13.8 02/06/2013   Lab Results  Component Value Date   WBC 7.0 02/06/2013   PLT 160 02/06/2013   No results found for this basename: INR   Lab Results  Component  Value Date   NA 144 02/06/2013   K 4.3 02/06/2013   CL 105 02/06/2013   CO2 28 02/06/2013   BUN 8 02/06/2013   CREATININE 0.60 02/06/2013   GLUCOSE 93 02/06/2013    Discharge Medications:     Medication List         aspirin 81 MG tablet  Take 81 mg by mouth daily.     gabapentin 100 MG capsule  Commonly known as:  NEURONTIN  Take 100 mg by mouth at bedtime.     methocarbamol 500 MG tablet  Commonly known as:  ROBAXIN  Take 1 tablet (500 mg total) by mouth every 6 (six) hours as needed (spasm).     oxyCODONE-acetaminophen 5-325 MG per tablet  Commonly known as:  ROXICET  Take 1-2 tablets by mouth every 4 (four) hours as needed for pain.     Vitamin D3 5000 UNITS Caps  Take 1 capsule by mouth daily.        Diagnostic Studies: Dg Lumbar Spine 1 View  Feb 10, 2013   CLINICAL DATA:  Lumbar surgery  EXAM: DG C-ARM 1-60 MIN; LUMBAR SPINE - 1 VIEW  COMPARISON:  09/20/2012  FINDINGS: A surgical instrument projects over the posterior elements at the L4 pedicle level, and in the L3-4 interspinous ligament.  IMPRESSION: Intraoperative marking at the L4 pedicle level.   Electronically Signed   By: Maryclare Bean M.D.   On: 02/10/2013 15:51   Dg C-arm 1-60 Min  02/10/13  CLINICAL DATA:  Lumbar surgery  EXAM: DG C-ARM 1-60 MIN; LUMBAR SPINE - 1 VIEW  COMPARISON:  09/20/2012  FINDINGS: A surgical instrument projects over the posterior elements at the L4 pedicle level, and in the L3-4 interspinous ligament.  IMPRESSION: Intraoperative marking at the L4 pedicle level.   Electronically Signed   By: Maryclare Bean M.D.   On: 02/08/2013 15:51    Disposition: 01-Home or Self Care      Discharge Orders   Future Orders Complete By Expires   Call MD / Call 911  As directed    Comments:     If you experience chest pain or shortness of breath, CALL 911 and be transported to the hospital emergency room.  If you develope a fever above 101 F, pus (white drainage) or increased drainage or redness at the wound,  or calf pain, call your surgeon's office.   Constipation Prevention  As directed    Comments:     Drink plenty of fluids.  Prune juice may be helpful.  You may use a stool softener, such as Colace (over the counter) 100 mg twice a day.  Use MiraLax (over the counter) for constipation as needed.   Diet - low sodium heart healthy  As directed    Increase activity slowly as tolerated  As directed       Follow-up Information   Follow up with NITKA,JAMES E, MD. Schedule an appointment as soon as possible for a visit in 2 weeks. (or as scheduled)    Specialty:  Orthopedic Surgery   Contact information:   [address] [place] [address] [phone]        Signed: Wende Neighbors 03/05/2013, 2:32 PM

## 2013-03-05 NOTE — Discharge Summary (Signed)
Patient d/c summary note reviewed.  

## 2013-05-10 NOTE — Progress Notes (Signed)
PT EVALUATION ADDENDUM  Late entry G Code  02/09/13 1221  PT Time Calculation  PT Start Time 1215  PT Stop Time 1250  PT Time Calculation (min) 35 min  PT G-Codes **NOT FOR INPATIENT CLASS**  Functional Assessment Tool Used CLINICAL JUDGEMENT  Functional Limitation Mobility: Walking and moving around  Mobility: Walking and Moving Around Current Status (Z6109(G8978) CI  Mobility: Walking and Moving Around Goal Status (U0454(G8979) CI  Mobility: Walking and Moving Around Discharge Status (U9811(G8980) CI  PT General Charges  $$ ACUTE PT VISIT 1 Procedure  PT Evaluation  $Initial PT Evaluation Tier I 1 Procedure  PT Treatments  $Gait Training 23-37 mins  Weldon PickingSusan Makila Colombe PT Acute Rehab Services [phone] Beeper [phone]

## 2013-05-10 NOTE — Progress Notes (Deleted)
PT EVALUATION ADDENDUM  Late Entry G -Code  02/09/13 1221  PT Time Calculation  PT Start Time 1215  PT Stop Time 1250  PT Time Calculation (min) 35 min  PT G-Codes **NOT FOR INPATIENT CLASS**  Functional Assessment Tool Used CLINICAL JUDGEMENT  Functional Limitation Mobility: Walking and moving around  Mobility: Walking and Moving Around Current Status (Y7829(G8978) CI  Mobility: Walking and Moving Around Goal Status (F6213(G8979) CI  Mobility: Walking and Moving Around Discharge Status (Y8657(G8980) CI  PT General Charges  $$ ACUTE PT VISIT 1 Procedure  PT Evaluation  $Initial PT Evaluation Tier I 1 Procedure  PT Treatments  $Gait Training 23-37 mins  Weldon PickingSusan Ruger Saxer PT Acute Rehab Services [phone] Beeper [phone]

## 2013-05-10 NOTE — Progress Notes (Signed)
Late Entry for G Code for OT Evaluation  02/09/13 1400  OT Time Calculation  OT Start Time 1304  OT Stop Time 1318  OT Time Calculation (min) 14 min  OT G-codes **NOT FOR INPATIENT CLASS**  Functional Assessment Tool Used clinical judgement  Functional Limitation Self care  Self Care Current Status [phone](G8987) CI  Self Care Goal Status (I6962(G8988) CI  Self Care Discharge Status (X5284(G8989) CI  OT General Charges  $OT Visit 1 Procedure  OT Evaluation  $Initial OT Evaluation Tier I 1 Procedure  OT Treatments  $Self Care/Home Management  8-22 mins  05/10/2013 Cipriano MileJohnson, Lajean Boese Elizabeth OTR/L Pager [phone] Office [phone]

## 2013-07-09 DIAGNOSIS — R930 Abnormal findings on diagnostic imaging of skull and head, not elsewhere classified: Secondary | ICD-10-CM

## 2013-07-09 DIAGNOSIS — G500 Trigeminal neuralgia: Secondary | ICD-10-CM

## 2013-07-09 DIAGNOSIS — M5412 Radiculopathy, cervical region: Secondary | ICD-10-CM | POA: Insufficient documentation

## 2013-07-09 HISTORY — DX: Abnormal findings on diagnostic imaging of skull and head, not elsewhere classified: R93.0

## 2013-07-09 HISTORY — DX: Radiculopathy, cervical region: M54.12

## 2013-07-09 HISTORY — DX: Trigeminal neuralgia: G50.0

## 2014-09-28 IMAGING — RF DG LUMBAR SPINE 1V
1 series · 1 of 1 positions shown · non-contrast
Comparison: 09/20/2012

CLINICAL DATA: Lumbar surgery

EXAM:
DG C-ARM 1-60 MIN; LUMBAR SPINE - 1 VIEW

[Series 1: run · 1 of 1 slices shown]
[im 1/1]
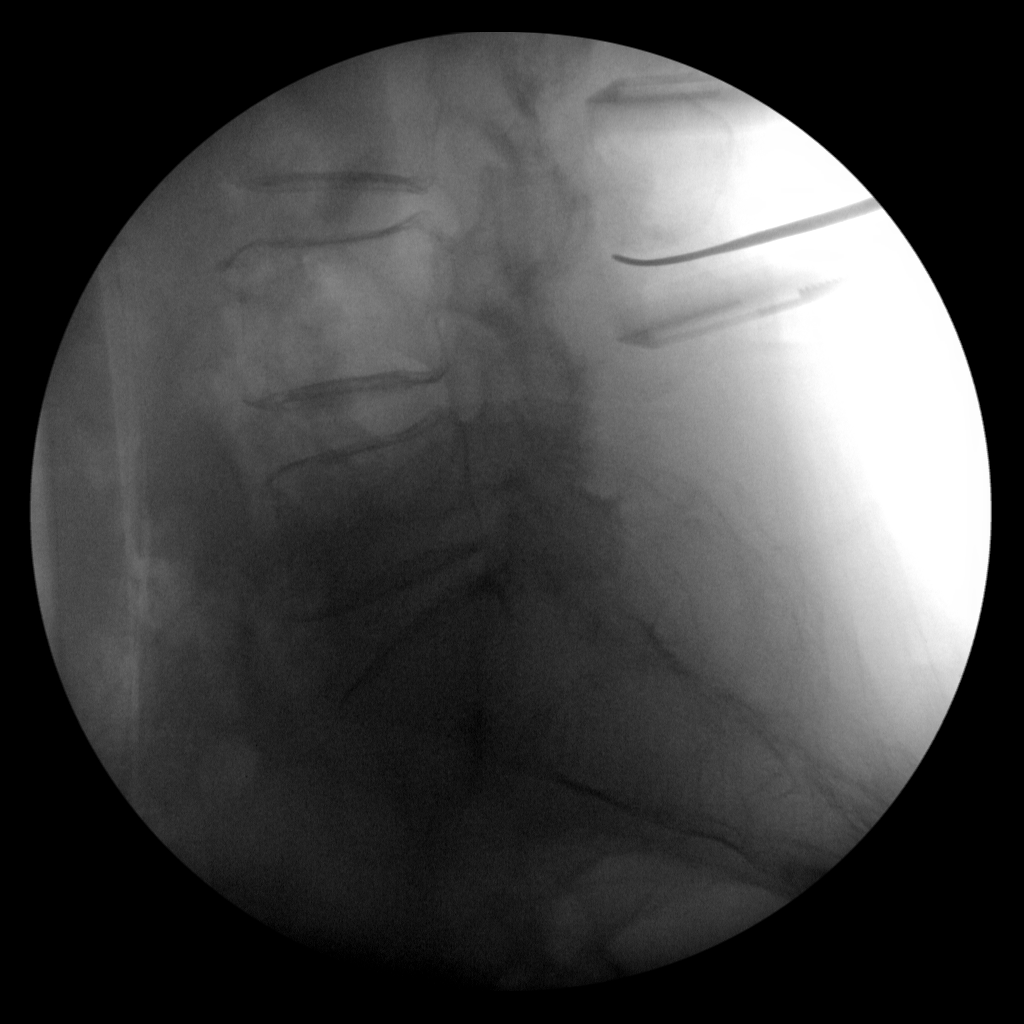

[1 of 1 positions shown; findings below may reference images not displayed]

FINDINGS: A surgical instrument projects over the posterior elements at the L4
pedicle level, and in the L3-4 interspinous ligament.
IMPRESSION: Intraoperative marking at the L4 pedicle level.

## 2014-12-23 ENCOUNTER — Other Ambulatory Visit: Payer: Self-pay | Admitting: Surgery

## 2014-12-23 DIAGNOSIS — M25511 Pain in right shoulder: Secondary | ICD-10-CM

## 2015-06-23 DIAGNOSIS — M8000XD Age-related osteoporosis with current pathological fracture, unspecified site, subsequent encounter for fracture with routine healing: Secondary | ICD-10-CM | POA: Insufficient documentation

## 2015-06-23 DIAGNOSIS — D696 Thrombocytopenia, unspecified: Secondary | ICD-10-CM

## 2015-06-23 DIAGNOSIS — E538 Deficiency of other specified B group vitamins: Secondary | ICD-10-CM | POA: Insufficient documentation

## 2015-06-23 DIAGNOSIS — M1990 Unspecified osteoarthritis, unspecified site: Secondary | ICD-10-CM | POA: Insufficient documentation

## 2015-06-23 DIAGNOSIS — E785 Hyperlipidemia, unspecified: Secondary | ICD-10-CM | POA: Insufficient documentation

## 2015-06-23 DIAGNOSIS — I4510 Unspecified right bundle-branch block: Secondary | ICD-10-CM

## 2015-06-23 DIAGNOSIS — M8580 Other specified disorders of bone density and structure, unspecified site: Secondary | ICD-10-CM

## 2015-06-23 DIAGNOSIS — E0590 Thyrotoxicosis, unspecified without thyrotoxic crisis or storm: Secondary | ICD-10-CM

## 2015-06-23 DIAGNOSIS — E042 Nontoxic multinodular goiter: Secondary | ICD-10-CM

## 2015-06-23 HISTORY — DX: Nontoxic multinodular goiter: E04.2

## 2015-06-23 HISTORY — DX: Age-related osteoporosis with current pathological fracture, unspecified site, subsequent encounter for fracture with routine healing: M80.00XD

## 2015-06-23 HISTORY — DX: Other specified disorders of bone density and structure, unspecified site: M85.80

## 2015-06-23 HISTORY — DX: Unspecified right bundle-branch block: I45.10

## 2015-06-23 HISTORY — DX: Thyrotoxicosis, unspecified without thyrotoxic crisis or storm: E05.90

## 2015-06-23 HISTORY — DX: Thrombocytopenia, unspecified: D69.6

## 2015-06-23 HISTORY — DX: Deficiency of other specified B group vitamins: E53.8

## 2015-06-23 HISTORY — DX: Hyperlipidemia, unspecified: E78.5

## 2015-06-23 HISTORY — DX: Unspecified osteoarthritis, unspecified site: M19.90

## 2015-06-25 DIAGNOSIS — I10 Essential (primary) hypertension: Secondary | ICD-10-CM | POA: Insufficient documentation

## 2015-06-25 HISTORY — DX: Essential (primary) hypertension: I10

## 2015-09-22 DIAGNOSIS — R490 Dysphonia: Secondary | ICD-10-CM

## 2015-09-22 HISTORY — DX: Dysphonia: R49.0

## 2015-10-15 DIAGNOSIS — H9202 Otalgia, left ear: Secondary | ICD-10-CM | POA: Insufficient documentation

## 2016-01-25 DIAGNOSIS — D692 Other nonthrombocytopenic purpura: Secondary | ICD-10-CM

## 2016-01-25 HISTORY — DX: Other nonthrombocytopenic purpura: D69.2

## 2017-06-15 DIAGNOSIS — R9431 Abnormal electrocardiogram [ECG] [EKG]: Secondary | ICD-10-CM

## 2017-06-15 DIAGNOSIS — R0789 Other chest pain: Secondary | ICD-10-CM

## 2017-06-15 HISTORY — DX: Other chest pain: R07.89

## 2017-06-15 HISTORY — DX: Abnormal electrocardiogram (ECG) (EKG): R94.31

## 2017-07-05 DIAGNOSIS — I25119 Atherosclerotic heart disease of native coronary artery with unspecified angina pectoris: Secondary | ICD-10-CM

## 2017-07-05 HISTORY — DX: Atherosclerotic heart disease of native coronary artery with unspecified angina pectoris: I25.119

## 2017-07-05 NOTE — Progress Notes (Signed)
Cardiology Office Note:    Date:  07/06/2017   ID:  Maureen Jensen, DOB [birth year], MRN [id number]  PCP:  Gordan Payment., MD  Cardiologist:  Norman Herrlich, MD    Referring MD: Gordan Payment., MD    ASSESSMENT:    1. Chest pain in adult   2. Essential hypertension   3. Right bundle branch block   4. Hyperlipidemia, unspecified hyperlipidemia type   5. SOB (shortness of breath)    PLAN:    In order of problems listed above:  1. Her symptoms are atypical angina she is in a high risk group I gave her a prescription for nitroglycerin to use as needed we discussed evaluation either functional testing or cardiac CTA and should undergo cardiac CTA with fractional flow reserve.  If she has high risk markers and hemodynamically significant stenosis she need to be considered for revascularization.  I asked her to continue her usual medicationsIncluding aspirin and beta-blocker. 2. Stable continue current treatment 3. Stable EKG 4. Stable if she has CAD should benefit from statin therapy 5. Echocardiogram ordered   Next appointment: 4 weeks   Medication Adjustments/Labs and Tests Ordered: Current medicines are reviewed at length with the patient today.  Concerns regarding medicines are outlined above.  Orders Placed This Encounter  Procedures  . CT CORONARY MORPH W/CTA COR W/SCORE W/CA W/CM &/OR WO/CM  . CT CORONARY MORPH W/CTA COR W/SCORE W/CA W/CM &/OR WO/CM  . CT CORONARY FRACTIONAL FLOW RESERVE DATA PREP  . CT CORONARY FRACTIONAL FLOW RESERVE FLUID ANALYSIS  . ECHOCARDIOGRAM COMPLETE   Meds ordered this encounter  Medications  . nitroGLYCERIN (NITROSTAT) 0.4 MG SL tablet    Sig: Place 1 tablet (0.4 mg total) under the tongue every 5 (five) minutes as needed for chest pain.    Dispense:  25 tablet    Refill:  11    Chief Complaint  Patient presents with  . New Patient (Initial Visit)    per Dr Shary Decamp to evaluate CP and [facility]    History of Present Illness:     Maureen Jensen is a 84 y.o. female with a hx of  hypertension hyperlipidemia last seen > 3-5 years ago in the past she has had a normal stress test..  Recently she has been aware of having chest discomfort.  There are 2 patterns the first occurs when she rolls over in bed is sharp localized and momentary.  The other episodes in the early morning and when she is active getting pressure in the chest relieved with rest and is not been severe prolonged has not awakened from sleep difficult to get her to typify how often but it sounds like it is occurring daily and occurring with activities more than ADLs or walking indoors.  She did not have the sensation coming into the office.  She has some palpitation with it but not severe sustained no syncope and also has shortness of breath with activity such as climbing her stairs.  There is been no orthopnea cough or wheezing. Compliance with diet, lifestyle and medications: Yes Past Medical History:  Diagnosis Date  . Abnormal EKG 06/15/2017  . 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 Chest pain, atypical 06/15/2017  . Essential hypertension 06/25/2015  . Hoarseness 09/22/2015   Last Assessment & Plan:  Hoarse on 20 mg of omeprazole. Would like to try 40 mg for one month. Will call with results.  Electronically signed by: Elder Cyphers  Dittmer, PA-C 10/15/15 0981  . Hyperlipidemia 06/23/2015  . Hyperthyroidism 06/23/2015   Overview:  Subclinical.  Using atenolol.  . Medical history non-contributory   . Multiple thyroid nodules 06/23/2015   Overview:  Biopsy 2014.    . Nonspecific abnormal findings on radiological and examination of skull and head 07/09/2013  . Osteoarthritis 06/23/2015  . Osteopenia 06/23/2015  . PONV (postoperative nausea and vomiting)   . Right bundle branch block 06/23/2015  . Senile purpura (HCC) 01/25/2016  . Spinal stenosis of lumbar region 02/08/2013  . Spinal stenosis, lumbar region, with neurogenic claudication  02/08/2013  . Thrombocytopenia (HCC) 06/23/2015  . Trigeminal neuralgia 07/09/2013  . Vitamin B12 deficiency 06/23/2015    Past Surgical History:  Procedure Laterality Date  . BACK SURGERY     1983  . CATARACT EXTRACTION W/ INTRAOCULAR LENS  IMPLANT, BILATERAL    . LUMBAR LAMINECTOMY Right 02/08/2013   Procedure: Right L3-4, L4-5 Hemi-laminectomy, decompression right L3,L4 and L5 nerve roots;  Surgeon: Kerrin Champagne, MD;  Location: [facility] OR;  Service: Orthopedics;  Laterality: Right;  Marland Kitchen VERTEBROPLASTY      Current Medications: Current Meds  Medication Sig  . alendronate (FOSAMAX) 70 MG tablet Take 1 tablet by mouth once a week.  Marland Kitchen aspirin 81 MG tablet Take 81 mg by mouth daily.  Marland Kitchen atenolol (TENORMIN) 25 MG tablet Take 25 mg by mouth daily.  Marland Kitchen gabapentin (NEURONTIN) 300 MG capsule TAKE 1 CAPSULE TWICE DAILY     Allergies:   Neuromuscular blocking agents and Statins   Social History   Socioeconomic History  . Marital status: Widowed    Spouse name: None  . Number of children: None  . Years of education: None  . Highest education level: None  Social Needs  . Financial resource strain: None  . Food insecurity - worry: None  . Food insecurity - inability: None  . Transportation needs - medical: None  . Transportation needs - non-medical: None  Occupational History  . None  Tobacco Use  . Smoking status: Never Smoker  . Smokeless tobacco: Never Used  Substance and Sexual Activity  . Alcohol use: No  . Drug use: No  . Sexual activity: None  Other Topics Concern  . None  Social History Narrative  . None     Family History: The patient's family history includes Angina in her mother; Colon cancer in her brother; Heart attack in her mother; Parkinson's disease in her brother; Stroke in her brother and father. ROS:   Please see the history of present illness.   He also complains of easy bruising All other systems reviewed and are negative.  EKGs/Labs/Other Studies Reviewed:      The following studies were reviewed today:  EKG:  06/15/17: Ventricular Rate 49BPM  Atrial Rate49BPM  P-R Interval 166 ms QRS Duration 80ms Q-T Interval 486 ms QTC439 ms P Axis 34degrees  R Axis -7degrees  T Axis 45degrees  Sinus bradycardia ST and T wave abnormality, consider anterior ischemia  Recent Labs: 06/15/17: WBC 4600 Plts 124000 Hgb 12.7 CMP normal TSH low 0.269  Recent Lipid Panel Chol 196 HDL 73 LDL 70   Physical Exam:    VS:  BP 138/68 (BP Location: Left Arm, Patient Position: Sitting, Cuff Size: Normal)   Pulse 62   Ht 5\' 3"  (1.6 m)   Wt 138 lb (62.6 kg)   SpO2 98%   BMI 24.45 kg/m     Wt Readings from Last 3 Encounters:  07/06/17  138 lb (62.6 kg)  02/08/13 140 lb (63.5 kg)  02/06/13 146 lb 11.2 oz (66.5 kg)     GEN:  Well nourished, well developed in no acute distress HEENT: Normal NECK: No JVD; No carotid bruits LYMPHATICS: No lymphadenopathy CARDIAC: RRR, no murmurs, rubs, gallops RESPIRATORY:  Clear to auscultation without rales, wheezing or rhonchi  ABDOMEN: Soft, non-tender, non-distended MUSCULOSKELETAL:  No edema; No deformity  SKIN: Warm and dry NEUROLOGIC:  Alert and oriented x 3 PSYCHIATRIC:  Normal affect    Signed, Norman HerrlichBrian Caeden Foots, MD  07/06/2017 12:44 PM    [facility]

## 2017-07-06 ENCOUNTER — Encounter: Payer: Self-pay | Admitting: Cardiology

## 2017-07-06 ENCOUNTER — Ambulatory Visit (INDEPENDENT_AMBULATORY_CARE_PROVIDER_SITE_OTHER): Payer: Medicare Other | Admitting: Cardiology

## 2017-07-06 VITALS — BP 138/68 | HR 62 | Ht 63.0 in | Wt 138.0 lb

## 2017-07-06 DIAGNOSIS — E785 Hyperlipidemia, unspecified: Secondary | ICD-10-CM | POA: Diagnosis not present

## 2017-07-06 DIAGNOSIS — I4510 Unspecified right bundle-branch block: Secondary | ICD-10-CM

## 2017-07-06 DIAGNOSIS — R079 Chest pain, unspecified: Secondary | ICD-10-CM | POA: Diagnosis not present

## 2017-07-06 DIAGNOSIS — R0602 Shortness of breath: Secondary | ICD-10-CM

## 2017-07-06 DIAGNOSIS — I10 Essential (primary) hypertension: Secondary | ICD-10-CM

## 2017-07-06 MED ORDER — NITROGLYCERIN 0.4 MG SL SUBL: 0.4000 mg | SUBLINGUAL_TABLET | SUBLINGUAL | 11 refills | Status: AC | PRN

## 2017-07-06 NOTE — Patient Instructions (Addendum)
Medication Instructions:  Your physician has recommended you make the following change in your medication:  START nitroglycerin 0.4 mg sublingual (under your tongue) every 5 minutes as needed for chest pain. When having chest pain, stop what you are doing and sit down. Take 1 nitro, wait 5 minutes. Still having chest pain, take 1 nitro, wait 5 minutes. Still having chest pain, take 1 nitro, dial 911. Total of 3 nitro in 15 minutes.  Labwork: None  Testing/Procedures: You had an EKG today.  Your physician has requested that you have an echocardiogram. Echocardiography is a painless test that uses sound waves to create images of your heart. It provides your doctor with information about the size and shape of your heart and how well your heart's chambers and valves are working. This procedure takes approximately one hour. There are no restrictions for this procedure.  Please arrive at the [facility] main entrance of [facility] at xx:xx AM (30-45 minutes prior to test start time)  [facility] [address] [place], [place] [address] [phone]  Proceed to the [facility] Radiology Department (First Floor).  Please follow these instructions carefully (unless otherwise directed):   On the Night Before the Test: . Drink plenty of water. . Do not consume any caffeinated/decaffeinated beverages or chocolate 12 hours prior to your test. . Do not take any antihistamines 12 hours prior to your test.  On the Day of the Test: . Drink plenty of water. Do not drink any water within one hour of the test. . Do not eat any food 4 hours prior to the test. . You may take your regular medications prior to the test.  After the Test: . Drink plenty of water. . After receiving IV contrast, you may experience a mild flushed feeling. This is normal. . On occasion, you may experience a mild rash up to 24 hours after the test. This is not dangerous. If this occurs, you can take  Benadryl 25 mg and increase your fluid intake. . If you experience trouble breathing, this can be serious. If it is severe call 911 IMMEDIATELY. If it is mild, please call our office. . If you take any of these medications: Glipizide/Metformin, Avandament, Glucavance, please do not take 48 hours after completing test.  Follow-Up: Your physician recommends that you schedule a follow-up appointment in: 4 weeks.  Any Other Special Instructions Will Be Listed Below (If Applicable).     If you need a refill on your cardiac medications before your next appointment, please call your pharmacy.    Angina Pectoris Angina pectoris is a very bad feeling in the chest, neck, or arm. Your doctor may call it angina. There are four types of angina. Angina is caused by a lack of blood in the middle and thickest layer of the heart wall (myocardium). Angina may feel like a crushing or squeezing pain in the chest. It may feel like tightness or heavy pressure in the chest. Some people say it feels like gas, heartburn, or indigestion. Some people have symptoms other than pain. These include:  Shortness of breath.  Cold sweats.  Feeling sick to your stomach (nausea).  Feeling light-headed.  Many women have chest discomfort and some of the other symptoms. However, women often have different symptoms, such as:  Feeling tired (fatigue).  Feeling nervous for no reason.  Feeling weak for no reason.  Dizziness or fainting.  Women may have angina without any symptoms. Follow these instructions at home:  Take medicines only as told by your doctor.  Take care of other health issues as told by your doctor. These include: ? High blood pressure (hypertension). ? Diabetes.  Follow a heart-healthy diet. Your doctor can help you to choose healthy food options and make changes.  Talk to your doctor to learn more about healthy cooking methods and use them. These  include: ? Roasting. ? Grilling. ? Broiling. ? Baking. ? Poaching. ? Steaming. ? Stir-frying.  Follow an exercise program approved by your doctor.  Keep a healthy weight. Lose weight as told by your doctor.  Rest when you are tired.  Learn to manage stress.  Do not use any tobacco, such as cigarettes, chewing tobacco, or electronic cigarettes. If you need help quitting, ask your doctor.  If you drink alcohol, and your doctor says it is okay, limit yourself to no more than 1 drink per day. One drink equals 12 ounces of beer, 5 ounces of wine, or 1 ounces of hard liquor.  Stop illegal drug use.  Keep all follow-up visits as told by your doctor. This is important. Do not take these medicines unless your doctor says that you can:  Nonsteroidal anti-inflammatory drugs (NSAIDs). These include: ? Ibuprofen. ? Naproxen. ? Celecoxib.  Vitamin supplements that have vitamin A, vitamin E, or both.  Hormone therapy that contains estrogen with or without progestin.  Get help right away if:  You have pain in your chest, neck, arm, jaw, stomach, or back that: ? Lasts more than a few minutes. ? Comes back. ? Does not get better after you take medicine under your tongue (sublingual nitroglycerin).  You have any of these symptoms for no reason: ? Gas, heartburn, or indigestion. ? Sweating a lot. ? Shortness of breath or trouble breathing. ? Feeling sick to your stomach or throwing up. ? Feeling more tired than usual. ? Feeling nervous or worrying more than usual. ? Feeling weak. ? Diarrhea.  You are suddenly dizzy or light-headed.  You faint or pass out. These symptoms may be an emergency. Do not wait to see if the symptoms will go away. Get medical help right away. Call your local emergency services (911 in the [place] Do not drive yourself to the hospital. This information is not intended to replace advice given to you by your health care provider. Make sure you discuss any  questions you have with your health care provider. Document Released: 10/12/2007 Document Revised: 10/01/2015 Document Reviewed: 08/27/2013 Elsevier Interactive Patient Education  2017 [employer]

## 2017-07-07 NOTE — Addendum Note (Signed)
Addended by: Ayesha MohairWELLS, MICHAELA E on: 07/07/2017 02:21 PM   Modules accepted: Orders

## 2017-07-19 ENCOUNTER — Telehealth: Payer: Self-pay | Admitting: Cardiology

## 2017-07-19 NOTE — Telephone Encounter (Signed)
Has not heard anything about cardiac CTA being scheduled yet. Advised that it does take time to get these tests approved by insurance and scheduled. Advised to give until beginning of next week and if have not heard anything yet to call back. Verbalized understanding, no further questions.

## 2017-07-19 NOTE — Telephone Encounter (Signed)
Please call daughter about medicare approving CT?

## 2017-07-31 ENCOUNTER — Other Ambulatory Visit: Payer: Self-pay | Admitting: Cardiology

## 2017-07-31 ENCOUNTER — Ambulatory Visit (HOSPITAL_BASED_OUTPATIENT_CLINIC_OR_DEPARTMENT_OTHER)
Admission: RE | Admit: 2017-07-31 | Discharge: 2017-07-31 | Disposition: A | Discharge status: Home / Self Care | Payer: Medicare Other | Source: Ambulatory Visit | Attending: Cardiology | Admitting: Cardiology

## 2017-07-31 DIAGNOSIS — R0789 Other chest pain: Secondary | ICD-10-CM

## 2017-07-31 DIAGNOSIS — I119 Hypertensive heart disease without heart failure: Secondary | ICD-10-CM | POA: Insufficient documentation

## 2017-07-31 DIAGNOSIS — I4510 Unspecified right bundle-branch block: Secondary | ICD-10-CM | POA: Insufficient documentation

## 2017-07-31 DIAGNOSIS — E785 Hyperlipidemia, unspecified: Secondary | ICD-10-CM | POA: Insufficient documentation

## 2017-07-31 DIAGNOSIS — I340 Nonrheumatic mitral (valve) insufficiency: Secondary | ICD-10-CM | POA: Insufficient documentation

## 2017-07-31 DIAGNOSIS — R079 Chest pain, unspecified: Secondary | ICD-10-CM

## 2017-07-31 DIAGNOSIS — I2510 Atherosclerotic heart disease of native coronary artery without angina pectoris: Secondary | ICD-10-CM

## 2017-07-31 DIAGNOSIS — R9431 Abnormal electrocardiogram [ECG] [EKG]: Secondary | ICD-10-CM

## 2017-07-31 NOTE — Progress Notes (Signed)
Echocardiogram 2D Echocardiogram has been performed.  Dorothey BasemanReel, Rhyan Radler M 07/31/2017, 10:06 AM

## 2017-08-01 ENCOUNTER — Telehealth: Payer: Self-pay

## 2017-08-01 DIAGNOSIS — I10 Essential (primary) hypertension: Secondary | ICD-10-CM

## 2017-08-01 NOTE — Telephone Encounter (Signed)
Patient advised of need for lab work prior to cardiac CTA. Patient advised [facility] could go [facility] [place]. Patient verbalized understanding to go to [facility] in [place] one day this week for lab work. No further questions.

## 2017-08-01 NOTE — Telephone Encounter (Signed)
-----   Message from Pricilla HolmSharon B Ferguson sent at 07/31/2017  2:37 PM EDT ----- Regarding: RE: Cardiac CTA Hey she is schedule for 08-09-17 @ 10:30.  Have [facility] to call regarding the appointment. She is going to need labs before the test. Also need. Dr. Dulce SellarMunley to sign the orders that we had to correct.  Need to be sign before the test - If they are not they will cancel the test.    ----- Message ----- From: Sharene ButtersWells, Chassity Ludke E, RN Sent: 07/31/2017   8:57 AM To: [facility], [facility] Pre Cert/Auth Subject: FW: Cardiac CTA                                Just following up on this patient's CTA. She said she hasn't received a phone call to schedule yet.  Thank you!   ----- Message ----- From: Sharene ButtersWells, Chassidy Layson E, RN Sent: 07/06/2017   8:58 AM To: [facility], [facility] Pre Cert/Auth Subject: Cardiac CTA                                    Please pre-cert/schedule:  [facility] Sellar[facility]un[facility]  [phone]  Thank you!!

## 2017-08-02 LAB — BASIC METABOLIC PANEL
BUN/Creatinine Ratio: 19 (ref 12–28)
BUN: 14 mg/dL (ref 8–27)
CO2: 26 mmol/L (ref 20–29)
CREATININE: 0.73 mg/dL (ref 0.57–1.00)
Calcium: 9.5 mg/dL (ref 8.7–10.3)
Chloride: 102 mmol/L (ref 96–106)
GFR, EST AFRICAN AMERICAN: 89 mL/min/{1.73_m2} (ref >59)
GFR, EST NON AFRICAN AMERICAN: 77 mL/min/{1.73_m2} (ref >59)
Glucose: 98 mg/dL (ref 65–99)
Potassium: 4.8 mmol/L (ref 3.5–5.2)
SODIUM: 139 mmol/L (ref 134–144)

## 2017-08-03 ENCOUNTER — Ambulatory Visit: Payer: Medicare Other | Admitting: Cardiology

## 2017-08-09 ENCOUNTER — Ambulatory Visit (HOSPITAL_COMMUNITY)
Admission: RE | Admit: 2017-08-09 | Discharge: 2017-08-09 | Disposition: A | Discharge status: Home / Self Care | Payer: Medicare Other | Source: Ambulatory Visit | Attending: Cardiology | Admitting: Cardiology

## 2017-08-09 ENCOUNTER — Encounter (HOSPITAL_COMMUNITY): Payer: Self-pay

## 2017-08-09 DIAGNOSIS — K449 Diaphragmatic hernia without obstruction or gangrene: Secondary | ICD-10-CM | POA: Diagnosis not present

## 2017-08-09 DIAGNOSIS — R0789 Other chest pain: Secondary | ICD-10-CM

## 2017-08-09 DIAGNOSIS — I2510 Atherosclerotic heart disease of native coronary artery without angina pectoris: Secondary | ICD-10-CM | POA: Diagnosis present

## 2017-08-09 DIAGNOSIS — I700 Atherosclerosis of aorta: Secondary | ICD-10-CM | POA: Insufficient documentation

## 2017-08-09 DIAGNOSIS — R9431 Abnormal electrocardiogram [ECG] [EKG]: Secondary | ICD-10-CM | POA: Diagnosis present

## 2017-08-09 DIAGNOSIS — R079 Chest pain, unspecified: Secondary | ICD-10-CM | POA: Diagnosis not present

## 2017-08-09 IMAGING — CT CT HEART MORP W/ CTA COR W/ SCORE W/ CA W/CM &/OR W/O CM
4 of 7 series · 8 of 20 positions shown, 9 images · non-contrast
Comparison: Chest radiograph 10/31/2016.  No prior CT.

CLINICAL DATA: Chest pain

EXAM:
Cardiac CTA
MEDICATIONS:
Sub lingual nitro.  4mg x 2 and lopressor 5mg IV
TECHNIQUE: The patient was scanned on a Siemens [REDACTED]ice scanner. Gantry
rotation speed was 250 msecs. Collimation was 0.6 mm. A 100 kV
prospective scan was triggered in the ascending thoracic aorta at
111 HU's with 35-75% of the R-R interval. Average HR during the scan
was 60 bpm. The 3D data set was interpreted on a dedicated work
station using MPR, MIP and VRT modes. A total of 80cc of contrast
was used.

[Series 6: best diast 71 % · axial · 0.33mm/px · z∈[+1276,+1327]mm · 2 of 382 slices shown, 3 images]
[im 128/382  vessel]
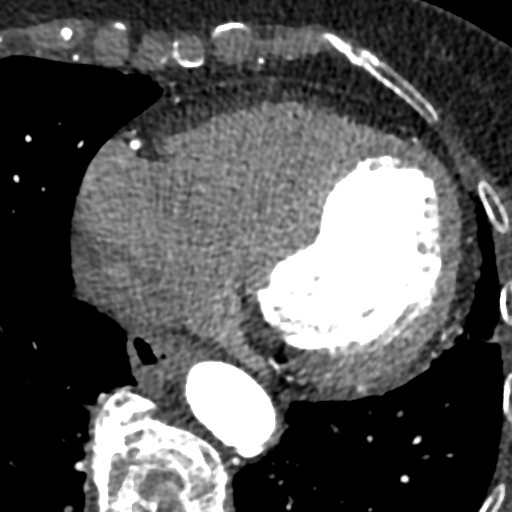
[im 128/382  lung]
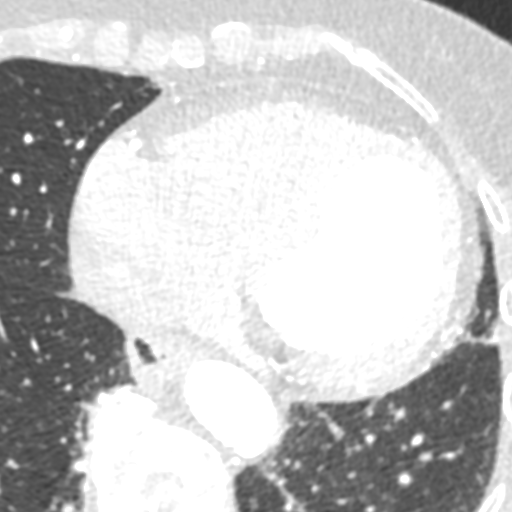
[im 255/382  vessel]
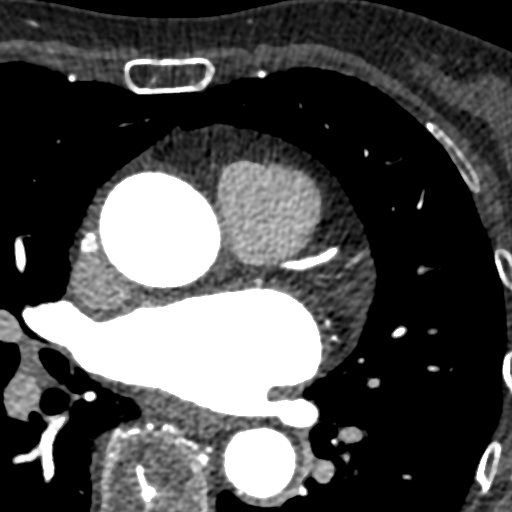

[Series 7: best syst 54 % · axial · 0.33mm/px · z∈[+1276,+1327]mm · 2 of 382 slices shown]
[im 128/382  vessel]
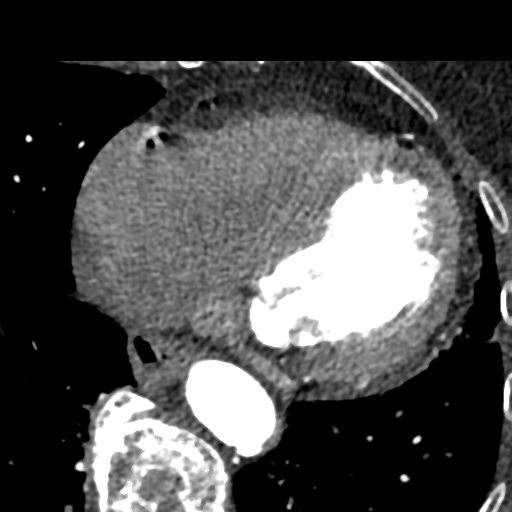
[im 255/382  vessel]
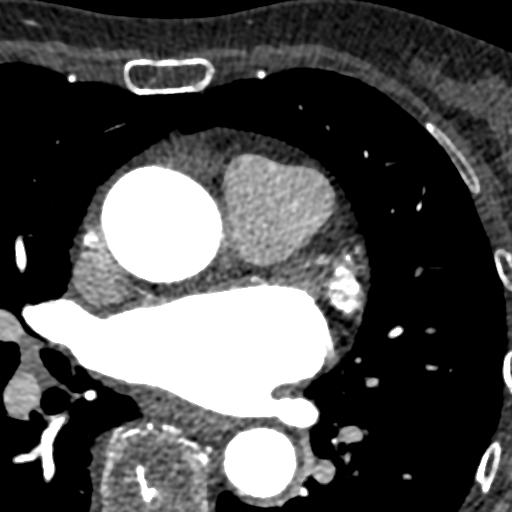

[Series 8: ts diast sharp 71 % · axial · 0.33mm/px · z∈[+1276,+1327]mm · 2 of 382 slices shown]
[im 128/382  lung]
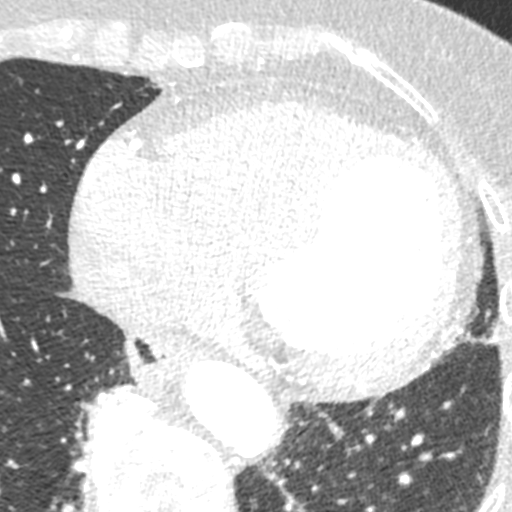
[im 255/382  lung]
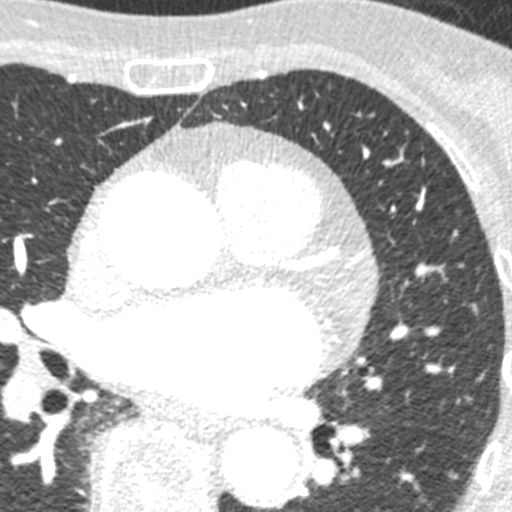

[Series 9: ts syst sharp 54 % · axial · 0.33mm/px · z∈[+1276,+1327]mm · 2 of 382 slices shown]
[im 128/382  lung]
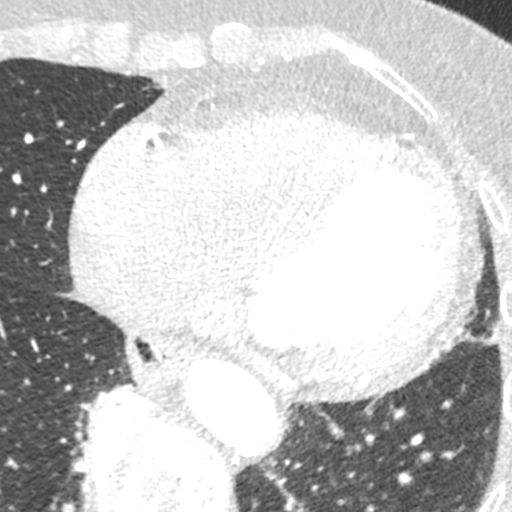
[im 255/382  lung]
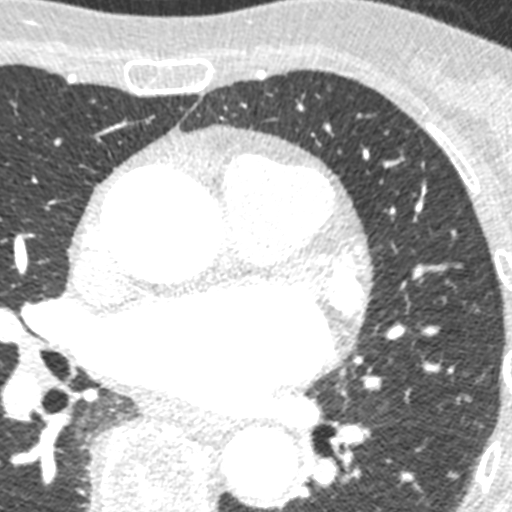

[8 of 20 positions shown; findings below may reference images not displayed]

FINDINGS: Non-cardiac: See separate report from [REDACTED].

Calcium Score: 199 Agatston units.

Coronary Arteries: Right dominant with no anomalies

LM: No plaque or stenosis.

LAD system: Calcified plaque proximal LAD, mild stenosis.

Circumflex system: Small ramus. No plaque or stenosis in LCx system.

RCA system: No plaque or stenosis.
IMPRESSION: 1. Coronary artery calcium score 199 Agatston units, placing the
patient in the 60th percentile for age and gender, suggesting
intermediate risk for future cardiac events.

2. Calcified plaque in the proximal LAD with mild stenosis. Will
confirm with CT FFR.

[place] Ot

EXAM:
OVER-READ INTERPRETATION  CT CHEST

The following report is an over-read performed by radiologist Dr.
Blain Jumper [REDACTED] on 08/09/2017. This over-read
does not include interpretation of cardiac or coronary anatomy or
pathology. The coronary CTA interpretation by the cardiologist is
attached.
FINDINGS: Vascular: Aortic atherosclerosis. Tortuous thoracic aorta. No
central pulmonary embolism, on this non-dedicated study.

Mediastinum/Nodes: Normal heart size without pericardial or pleural
effusion. Tiny hiatal hernia. Subtle esophageal fluid level on image
32/11.

Lungs/Pleura: No pleural fluid. Bibasilar dependent atelectasis or
scar.

Upper Abdomen: Normal imaged portions of the liver, spleen, right
kidney. Suspect left renal scarring. Proximal gastric
underdistention.

Musculoskeletal: Mild osteopenia with lower thoracic vertebral
augmentation at 2 levels.
IMPRESSION: 1.  No acute findings in the imaged extracardiac chest.
2.  Aortic Atherosclerosis ([id number]).
3.  Tiny hiatal hernia.
4. Esophageal air fluid level suggests dysmotility or
gastroesophageal reflux.

## 2017-08-09 MED ORDER — NITROGLYCERIN 0.4 MG SL SUBL
0.8000 mg | SUBLINGUAL_TABLET | Freq: Once | SUBLINGUAL | Status: AC
  Administered 2017-08-09: 0.8 mg via SUBLINGUAL

## 2017-08-09 MED ORDER — METOPROLOL TARTRATE 5 MG/5ML IV SOLN
5.0000 mg | INTRAVENOUS | Status: DC | PRN
  Administered 2017-08-09: 5 mg via INTRAVENOUS

## 2017-08-09 MED ORDER — NITROGLYCERIN 0.4 MG SL SUBL
SUBLINGUAL_TABLET | SUBLINGUAL | Status: AC
  Administered 2017-08-09: 0.8 mg via SUBLINGUAL
  Filled 2017-08-09: qty 2

## 2017-08-09 MED ORDER — IOPAMIDOL (ISOVUE-370) INJECTION 76%
INTRAVENOUS | Status: AC
  Administered 2017-08-09: 80 mL via INTRAVENOUS
  Filled 2017-08-09: qty 100

## 2017-08-09 MED ORDER — METOPROLOL TARTRATE 5 MG/5ML IV SOLN
INTRAVENOUS | Status: AC
  Filled 2017-08-09: qty 10

## 2017-08-14 NOTE — Progress Notes (Signed)
Cardiology Office Note:    Date:  08/15/2017   ID:  Maureen Jensen, DOB 1933/10/27, MRN [id number]  PCP:  Gordan PaymentGrisso, Greg A., MD  Cardiologist:  Maureen HerrlichBrian Ysabela Keisler, MD    Referring MD: Gordan PaymentGrisso, Greg A., MD    ASSESSMENT:    1. Coronary artery disease involving native coronary artery of native heart with angina pectoris (HCC)   2. Essential hypertension   3. Hyperlipidemia, unspecified hyperlipidemia type    PLAN:    In order of problems listed above:  1. She has mild CAD in a stable pattern of angina.  She has relative bradycardia and will avoid beta-blockers and rate slowing calcium channel blockers and had headache and dizziness after nitroglycerin some place her on amlodipine as antianginal therapy and reassess in the office in 6 months.  She has nitroglycerin which she can use as needed. 2. Stable continue current treatment   Next appointment: 3 months   Medication Adjustments/Labs and Tests Ordered: Current medicines are reviewed at length with the patient today.  Concerns regarding medicines are outlined above.  No orders of the defined types were placed in this encounter.  Meds ordered this encounter  Medications  . amLODipine (NORVASC) 5 MG tablet    Sig: Take 1 tablet (5 mg total) by mouth daily.    Dispense:  90 tablet    Refill:  3    Chief Complaint  Patient presents with  . Follow-up    1 month flup after cardiac CTA  . Coronary Artery Disease    History of Present Illness:    Maureen HuffShirley M Conaty is a 84 y.o. female with a hx of chest pain, hypertension hyperlipidemia  last seen 07/06/17.  ASSESSMENT:    07/06/17   1. Chest pain in adult   2. Essential hypertension   3. Right bundle branch block   4. Hyperlipidemia, unspecified hyperlipidemia type   5. SOB (shortness of breath)    PLAN:    1. Her symptoms are atypical angina she is in a high risk group I gave her a prescription for nitroglycerin to use as needed we discussed evaluation either  functional testing or cardiac CTA and should undergo cardiac CTA with fractional flow reserve.  If she has high risk markers and hemodynamically significant stenosis she need to be considered for revascularization.  I asked her to continue her usual medicationsIncluding aspirin and beta-blocker. 2. Stable continue current treatment 3. Stable EKG 4. Stable if she has CAD should benefit from statin therapy 5. Echocardiogram ordered  Compliance with diet, lifestyle and medications: yes  She continues to have symptoms of shortness of breath and chest tightness after vigorous activities such as climbing stairs and heavy garden work.  She has no edema orthopnea palpitations syncope or TIA.  Echocardiogram does not show significant valvular heart disease or findings of heart failure after cardiac CTA is consistent with mild CAD.  She will begin medical treatment with a calcium channel blocker nonrate limiting. Past Medical History:  Diagnosis Date  . Abnormal EKG 06/15/2017  . 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 Chest pain, atypical 06/15/2017  . Essential hypertension 06/25/2015  . Hoarseness 09/22/2015   Last Assessment & Plan:  Hoarse on 20 mg of omeprazole. Would like to try 40 mg for one month. Will call with results.  Electronically signed by: Alphonzo DublinMonica Marie Dittmer, PA-C 10/15/15 [phone]  . Hyperlipidemia 06/23/2015  . Hyperthyroidism 06/23/2015   Overview:  Subclinical.  Using atenolol.  .Marland Kitchen  Medical history non-contributory   . Multiple thyroid nodules 06/23/2015   Overview:  Biopsy 2014.    . Nonspecific abnormal findings on radiological and examination of skull and head 07/09/2013  . Osteoarthritis 06/23/2015  . Osteopenia 06/23/2015  . PONV (postoperative nausea and vomiting)   . Right bundle branch block 06/23/2015  . Senile purpura (HCC) 01/25/2016  . Spinal stenosis of lumbar region 02/08/2013  . Spinal stenosis, lumbar region, with neurogenic claudication 02/08/2013  .  Thrombocytopenia (HCC) 06/23/2015  . Trigeminal neuralgia 07/09/2013  . Vitamin B12 deficiency 06/23/2015    Past Surgical History:  Procedure Laterality Date  . BACK SURGERY     1983  . CATARACT EXTRACTION W/ INTRAOCULAR LENS  IMPLANT, BILATERAL    . LUMBAR LAMINECTOMY Right 02/08/2013   Procedure: Right L3-4, L4-5 Hemi-laminectomy, decompression right L3,L4 and L5 nerve roots;  Surgeon: Kerrin Champagne, MD;  Location: [facility] OR;  Service: Orthopedics;  Laterality: Right;  Marland Kitchen VERTEBROPLASTY      Current Medications: Current Meds  Medication Sig  . alendronate (FOSAMAX) 70 MG tablet Take 1 tablet by mouth once a week.  Marland Kitchen aspirin 81 MG tablet Take 81 mg by mouth daily.  Marland Kitchen atenolol (TENORMIN) 25 MG tablet Take 25 mg by mouth daily.  Marland Kitchen gabapentin (NEURONTIN) 300 MG capsule TAKE 1 CAPSULE TWICE DAILY  . nitroGLYCERIN (NITROSTAT) 0.4 MG SL tablet Place 1 tablet (0.4 mg total) under the tongue every 5 (five) minutes as needed for chest pain.     Allergies:   Neuromuscular blocking agents and Statins   Social History   Socioeconomic History  . Marital status: Widowed    Spouse name: Not on file  . Number of children: Not on file  . Years of education: Not on file  . Highest education level: Not on file  Occupational History  . Not on file  Social Needs  . Financial resource strain: Not on file  . Food insecurity:    Worry: Not on file    Inability: Not on file  . Transportation needs:    Medical: Not on file    Non-medical: Not on file  Tobacco Use  . Smoking status: Never Smoker  . Smokeless tobacco: Never Used  Substance and Sexual Activity  . Alcohol use: No  . Drug use: No  . Sexual activity: Not on file  Lifestyle  . Physical activity:    Days per week: Not on file    Minutes per session: Not on file  . Stress: Not on file  Relationships  . Social connections:    Talks on phone: Not on file    Gets together: Not on file    Attends religious service: Not on file     Active member of club or organization: Not on file    Attends meetings of clubs or organizations: Not on file    Relationship status: Not on file  Other Topics Concern  . Not on file  Social History Narrative  . Not on file     Family History: The patient's family history includes Angina in her mother; Colon cancer in her brother; Heart attack in her mother; Parkinson's disease in her brother; Stroke in her brother and father. ROS:   Please see the history of present illness.    All other systems reviewed and are negative.  EKGs/Labs/Other Studies Reviewed:    The following studies were reviewed today  Echo TTE 07/31/17:Study Conclusions - Left ventricle: The cavity size was normal.  Systolic function was   normal. The estimated ejection fraction was in the range of 55%   to 60%. Wall motion was normal; there were no regional wall   motion abnormalities. Doppler parameters are consistent with   abnormal left ventricular relaxation (grade 1 diastolic   dysfunction). - Aortic valve: There was trivial regurgitation. - Mitral valve: Calcified annulus. There was mild regurgitation. - Left atrium: The atrium was mildly to moderately dilated.  Cardiac CTA:  IMPRESSION: 1. Coronary artery calcium score 199 Agatston units, placing the patient in the 60th percentile for age and gender, suggesting intermediate risk for future cardiac events. 2. Calcified plaque in the proximal LAD with mild stenosis. Will confirm with CT FFR.  FFR analysis shows no hemodynamically significant disease.  Recent Labs: 08/02/2017: BUN 14; Creatinine, Ser 0.73; Potassium 4.8; Sodium 139  Recent Lipid Panel No results found for: CHOL, TRIG, HDL, CHOLHDL, VLDL, LDLCALC, LDLDIRECT  Physical Exam:    VS:  BP (!) 142/72 (BP Location: Right Arm, Patient Position: Sitting, Cuff Size: Normal)   Pulse (!) 56   Ht 5\' 3"  (1.6 m)   Wt 140 lb (63.5 kg)   SpO2 98%   BMI 24.80 kg/m     Wt Readings from Last 3  Encounters:  08/15/17 140 lb (63.5 kg)  07/06/17 138 lb (62.6 kg)  02/08/13 140 lb (63.5 kg)     GEN:  Well nourished, well developed in no acute distress HEENT: Normal NECK: No JVD; No carotid bruits LYMPHATICS: No lymphadenopathy CARDIAC: RRR, no murmurs, rubs, gallops RESPIRATORY:  Clear to auscultation without rales, wheezing or rhonchi  ABDOMEN: Soft, non-tender, non-distended MUSCULOSKELETAL:  No edema; No deformity  SKIN: Warm and dry NEUROLOGIC:  Alert and oriented x 3 PSYCHIATRIC:  Normal affect    Signed, Maureen Herrlich, MD  08/15/2017 9:17 AM    [facility]

## 2017-08-15 ENCOUNTER — Ambulatory Visit (INDEPENDENT_AMBULATORY_CARE_PROVIDER_SITE_OTHER): Payer: Medicare Other | Admitting: Cardiology

## 2017-08-15 ENCOUNTER — Encounter: Payer: Self-pay | Admitting: Cardiology

## 2017-08-15 VITALS — BP 142/72 | HR 56 | Ht 63.0 in | Wt 140.0 lb

## 2017-08-15 DIAGNOSIS — I25119 Atherosclerotic heart disease of native coronary artery with unspecified angina pectoris: Secondary | ICD-10-CM

## 2017-08-15 DIAGNOSIS — I10 Essential (primary) hypertension: Secondary | ICD-10-CM | POA: Diagnosis not present

## 2017-08-15 MED ORDER — AMLODIPINE BESYLATE 5 MG PO TABS: 5.0000 mg | ORAL_TABLET | Freq: Every day | ORAL | 3 refills | Status: DC

## 2017-08-15 NOTE — Patient Instructions (Addendum)
Medication Instructions:  Your physician has recommended you make the following change in your medication: START amlodipine 5 mg daily  Check blood pressure at home 2-3 times weekly. Call if blood pressure less than 110 for top number.  Labwork: None  Testing/Procedures: None  Follow-Up: Your physician wants you to follow-up in: 3 months. You will receive a reminder letter in the mail two months in advance. If you don't receive a letter, please call our office to schedule the follow-up appointment.  Any Other Special Instructions Will Be Listed Below (If Applicable).     If you need a refill on your cardiac medications before your next appointment, please call your pharmacy.

## 2017-08-17 ENCOUNTER — Telehealth: Payer: Self-pay

## 2017-08-17 MED ORDER — AMLODIPINE BESYLATE 5 MG PO TABS: 5.0000 mg | ORAL_TABLET | ORAL | 3 refills | Status: DC

## 2017-08-17 NOTE — Telephone Encounter (Signed)
Patient's daughter, Clydie BraunKaren, called stating that after patient started amlodipine 5 mg daily, she has been feeling dizzy. Blood pressure has been in the 110s systolic. Discussed with Dr. Dulce SellarMunley. Advised for patient to take amlodipine 5 mg every other day. Clydie BraunKaren verbalized understanding and will try dosage change for about a week or two to check for improvement. No further questions.

## 2017-08-18 ENCOUNTER — Other Ambulatory Visit: Payer: Self-pay

## 2017-08-18 MED ORDER — AMLODIPINE BESYLATE 5 MG PO TABS: 5.0000 mg | ORAL_TABLET | ORAL | 3 refills | Status: DC

## 2017-09-25 ENCOUNTER — Telehealth (INDEPENDENT_AMBULATORY_CARE_PROVIDER_SITE_OTHER): Payer: Self-pay | Admitting: Specialist

## 2017-09-25 NOTE — Telephone Encounter (Signed)
Patient had back surgery with Dr. Otelia Sergeant in 2014 and she believes she "over did it". She said it feels like it is catching and painful like it was before. She said she doesn't want to waste any ones time by making an appointment so she is wondering if there is anything she can do? CB # [phone]

## 2017-09-26 NOTE — Telephone Encounter (Signed)
I called and advised that she could use ice/heat on it, she could take OTC meds and give it a few days and if its not better to call back and make an appt with either Dr. Otelia Sergeant or Fayrene Fearing.  She states that she understands this.

## 2017-09-29 ENCOUNTER — Other Ambulatory Visit: Payer: Self-pay | Admitting: Orthopaedic Surgery

## 2017-11-06 HISTORY — PX: BACK SURGERY: SHX140

## 2017-11-21 DIAGNOSIS — S22070A Wedge compression fracture of T9-T10 vertebra, initial encounter for closed fracture: Secondary | ICD-10-CM

## 2017-11-21 HISTORY — DX: Wedge compression fracture of T9-T10 vertebra, initial encounter for closed fracture: S22.070A

## 2018-01-31 NOTE — Progress Notes (Signed)
Cardiology Office Note:    Date:  02/01/2018   ID:  Maureen Jensen, DOB [birth year], MRN [id number]  PCP:  Gordan Payment., MD  Cardiologist:  Norman Herrlich, MD    Referring MD: Gordan Payment., MD    ASSESSMENT:    1. Coronary artery disease involving native coronary artery of native heart with angina pectoris (HCC)   2. Essential hypertension   3. Right bundle branch block    PLAN:    In order of problems listed above:  1. Stable she has infrequent episodes of typical angina has not required nitroglycerin continue current treatment of aspirin calcium channel blocker and I would not initiate a statin as her lipids are ideal without and she has chronic pain syndrome with her back and be difficult to tell she is having toxicity. 2. Stable continue current treatment including calcium channel blocker 3.    Next appointment: One year   Medication Adjustments/Labs and Tests Ordered: Current medicines are reviewed at length with the patient today.  Concerns regarding medicines are outlined above.  No orders of the defined types were placed in this encounter.  No orders of the defined types were placed in this encounter.   Chief Complaint  Patient presents with  . Follow-up  . Coronary Artery Disease  . Hypertension  . Hyperlipidemia    History of Present Illness:    Maureen Jensen is a 84 y.o. female with a hx of chest pain, hypertension hyperlipidemia  last seen 08/15/17.  Overall she is done well she continues to have back pain and had another kyphoplasty.  Despite this she remains quite active doing gardening work she gets mild chest discomfort and shortness of breath climbing multiple flights of stairs sometimes with stress but has not required nitroglycerin the episodes are infrequent and not severe.  No palpitation or syncope tolerates medications including calcium channel blocker and aspirin.  She had a cardiac CTA : 09/04/17 IMPRESSION: 1. Coronary artery  calcium score 199 Agatston units, placing the patient in the 60th percentile for age and gender, suggesting intermediate risk for future cardiac events.  2. Calcified plaque in the proximal LAD with mild stenosis. Will confirm with CT FFR. 3. FFR analysis shows no hemodynamically significant disease.  Compliance with diet, lifestyle and medications: yes Past Medical History:  Diagnosis Date  . Abnormal EKG 06/15/2017  . 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 Chest pain, atypical 06/15/2017  . Essential hypertension 06/25/2015  . Hoarseness 09/22/2015   Last Assessment & Plan:  Hoarse on 20 mg of omeprazole. Would like to try 40 mg for one month. Will call with results.  Electronically signed by: Alphonzo Dublin, PA-C 10/15/15 [phone]  . Hyperlipidemia 06/23/2015  . Hyperthyroidism 06/23/2015   Overview:  Subclinical.  Using atenolol.  . Medical history non-contributory   . Multiple thyroid nodules 06/23/2015   Overview:  Biopsy 2014.    . Nonspecific abnormal findings on radiological and examination of skull and head 07/09/2013  . Osteoarthritis 06/23/2015  . Osteopenia 06/23/2015  . PONV (postoperative nausea and vomiting)   . Right bundle branch block 06/23/2015  . Senile purpura (HCC) 01/25/2016  . Spinal stenosis of lumbar region 02/08/2013  . Spinal stenosis, lumbar region, with neurogenic claudication 02/08/2013  . Thrombocytopenia (HCC) 06/23/2015  . Trigeminal neuralgia 07/09/2013  . Vitamin B12 deficiency 06/23/2015    Past Surgical History:  Procedure Laterality Date  . BACK SURGERY  1983  . BACK SURGERY  11/2017   Put "cement" in 2 places in back due to broken bones.  Marland Kitchen CATARACT EXTRACTION W/ INTRAOCULAR LENS  IMPLANT, BILATERAL    . LUMBAR LAMINECTOMY Right 02/08/2013   Procedure: Right L3-4, L4-5 Hemi-laminectomy, decompression right L3,L4 and L5 nerve roots;  Surgeon: Kerrin Champagne, MD;  Location: [facility] OR;  Service: Orthopedics;  Laterality: Right;  Marland Kitchen  VERTEBROPLASTY      Current Medications: Current Meds  Medication Sig  . alendronate (FOSAMAX) 70 MG tablet Take 1 tablet by mouth once a week.  Marland Kitchen amLODipine (NORVASC) 5 MG tablet Take 1 tablet (5 mg total) by mouth every other day.  Marland Kitchen aspirin 81 MG tablet Take 81 mg by mouth daily.  Marland Kitchen atenolol (TENORMIN) 25 MG tablet Take 25 mg by mouth daily.  Marland Kitchen gabapentin (NEURONTIN) 300 MG capsule TAKE 1 CAPSULE TWICE DAILY  . Multiple Vitamins-Minerals (MULTIVITAMIN WITH MINERALS) tablet Take 1 tablet by mouth daily.  . nitroGLYCERIN (NITROSTAT) 0.4 MG SL tablet Place 1 tablet (0.4 mg total) under the tongue every 5 (five) minutes as needed for chest pain.     Allergies:   Neuromuscular blocking agents and Statins   Social History   Socioeconomic History  . Marital status: Widowed    Spouse name: Not on file  . Number of children: Not on file  . Years of education: Not on file  . Highest education level: Not on file  Occupational History  . Not on file  Social Needs  . Financial resource strain: Not on file  . Food insecurity:    Worry: Not on file    Inability: Not on file  . Transportation needs:    Medical: Not on file    Non-medical: Not on file  Tobacco Use  . Smoking status: Never Smoker  . Smokeless tobacco: Never Used  Substance and Sexual Activity  . Alcohol use: No  . Drug use: No  . Sexual activity: Not on file  Lifestyle  . Physical activity:    Days per week: Not on file    Minutes per session: Not on file  . Stress: Not on file  Relationships  . Social connections:    Talks on phone: Not on file    Gets together: Not on file    Attends religious service: Not on file    Active member of club or organization: Not on file    Attends meetings of clubs or organizations: Not on file    Relationship status: Not on file  Other Topics Concern  . Not on file  Social History Narrative  . Not on file     Family History: The patient's family history includes Angina  in her mother; Colon cancer in her brother; Heart attack in her mother; Parkinson's disease in her brother; Stroke in her brother and father. ROS:   Please see the history of present illness.    All other systems reviewed and are negative.  EKGs/Labs/Other Studies Reviewed:    The following studies were reviewed today:  EKG: She left the office prior to EKG being performed has been called will return I will place an addendum to this note when it is performed later today or tomorrow  Recent Labs:   12/19/2017 CMP normal cholesterol 161 HDL 76 LDL 45 08/02/2017: BUN 14; Creatinine, Ser 0.73; Potassium 4.8; Sodium 139  Recent Lipid Panel No results found for: CHOL, TRIG, HDL, CHOLHDL, VLDL, LDLCALC, LDLDIRECT  Physical Exam:  VS:  BP 100/66 (BP Location: Right Arm, Patient Position: Sitting, Cuff Size: Normal)   Pulse (!) 54   Ht 5\' 3"  (1.6 m)   Wt 135 lb (61.2 kg)   SpO2 94%   BMI 23.91 kg/m     Wt Readings from Last 3 Encounters:  02/01/18 135 lb (61.2 kg)  08/15/17 140 lb (63.5 kg)  07/06/17 138 lb (62.6 kg)     GEN:  Well nourished, well developed in no acute distress HEENT: Normal NECK: No JVD; No carotid bruits LYMPHATICS: No lymphadenopathy CARDIAC: RRR, no murmurs, rubs, gallops RESPIRATORY:  Clear to auscultation without rales, wheezing or rhonchi  ABDOMEN: Soft, non-tender, non-distended MUSCULOSKELETAL:  No edema; No deformity  SKIN: Warm and dry NEUROLOGIC:  Alert and oriented x 3 PSYCHIATRIC:  Normal affect    Signed, Norman Herrlich, MD  02/01/2018 9:06 AM    [facility]

## 2018-02-01 ENCOUNTER — Encounter: Payer: Self-pay | Admitting: Cardiology

## 2018-02-01 ENCOUNTER — Ambulatory Visit (INDEPENDENT_AMBULATORY_CARE_PROVIDER_SITE_OTHER): Payer: Medicare Other | Admitting: Cardiology

## 2018-02-01 VITALS — BP 100/66 | HR 54 | Ht 63.0 in | Wt 135.0 lb

## 2018-02-01 DIAGNOSIS — I10 Essential (primary) hypertension: Secondary | ICD-10-CM | POA: Diagnosis not present

## 2018-02-01 DIAGNOSIS — I4510 Unspecified right bundle-branch block: Secondary | ICD-10-CM

## 2018-02-01 DIAGNOSIS — I25119 Atherosclerotic heart disease of native coronary artery with unspecified angina pectoris: Secondary | ICD-10-CM | POA: Diagnosis not present

## 2018-02-01 NOTE — Patient Instructions (Addendum)
Medication Instructions:  Your physician recommends that you continue on your current medications as directed. Please refer to the Current Medication list given to you today.   Labwork: None  Testing/Procedures: None   Follow-Up: Your physician wants you to follow-up in: 1 year. You will receive a reminder letter in the mail two months in advance. If you don't receive a letter, please call our office to schedule the follow-up appointment.  **You will be scheduled for a nurse visit to have an EKG completed.   If you need a refill on your cardiac medications before your next appointment, please call your pharmacy.   Thank you for choosing [facility]! Mady Gemma, RN [phone]

## 2018-09-04 ENCOUNTER — Other Ambulatory Visit: Payer: Self-pay | Admitting: Cardiology

## 2018-09-04 NOTE — Telephone Encounter (Signed)
Rx refill sent to pharmacy. 

## 2018-10-03 ENCOUNTER — Other Ambulatory Visit: Payer: Self-pay

## 2018-10-03 MED ORDER — AMLODIPINE BESYLATE 5 MG PO TABS: 5.0000 mg | ORAL_TABLET | ORAL | 1 refills | Status: DC

## 2018-10-03 NOTE — Telephone Encounter (Signed)
Refill for amlodipine sent to pharmacy

## 2019-04-06 ENCOUNTER — Other Ambulatory Visit: Payer: Self-pay | Admitting: Cardiology

## 2019-04-28 ENCOUNTER — Other Ambulatory Visit: Payer: Self-pay | Admitting: Cardiology

## 2019-10-31 DIAGNOSIS — J310 Chronic rhinitis: Secondary | ICD-10-CM

## 2019-10-31 HISTORY — DX: Chronic rhinitis: J31.0

## 2020-05-20 DIAGNOSIS — R112 Nausea with vomiting, unspecified: Secondary | ICD-10-CM | POA: Insufficient documentation

## 2020-05-20 DIAGNOSIS — Z789 Other specified health status: Secondary | ICD-10-CM | POA: Insufficient documentation

## 2020-05-26 NOTE — Progress Notes (Deleted)
Cardiology Office Note:    Date:  05/26/2020   ID:  Maureen Jensen, DOB 1934-01-24, MRN [id number]  PCP:  Gordan Payment., MD  Cardiologist:  Norman Herrlich, MD    Referring MD: Gordan Payment., MD    ASSESSMENT:    No diagnosis found. PLAN:    In order of problems listed above:  1. ***   Next appointment: ***   Medication Adjustments/Labs and Tests Ordered: Current medicines are reviewed at length with the patient today.  Concerns regarding medicines are outlined above.  No orders of the defined types were placed in this encounter.  No orders of the defined types were placed in this encounter.   No chief complaint on file.   History of Present Illness:    Maureen Jensen is a 87 y.o. female with a hx of mild nonobstructive CAD calcium score of 199 60th percentile for age and gender, hypertension and right bundle branch block last seen 02/01/2018. Compliance with diet, lifestyle and medications: ***  Recent labs [facility] PCP 05/06/2020: TSH decreased 0.57, CMP normal potassium 4.1 sodium 141 creatinine 0.66 estimated GFR 87 cc/min normal liver function test Cholesterol 163 LDL 49 triglycerides 100 HDL 64 non-HDL cholesterol 99 CBC with a hemoglobin 11.8 platelets 119,000 Past Medical History:  Diagnosis Date  . Abnormal EKG 06/15/2017  . 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 Chest pain, atypical 06/15/2017  . Essential hypertension 06/25/2015  . Hoarseness 09/22/2015   Last Assessment & Plan:  Hoarse on 20 mg of omeprazole. Would like to try 40 mg for one month. Will call with results.  Electronically signed by: Alphonzo Dublin, PA-C 10/15/15 [phone]  . Hyperlipidemia 06/23/2015  . Hyperthyroidism 06/23/2015   Overview:  Subclinical.  Using atenolol.  . Medical history non-contributory   . Multiple thyroid nodules 06/23/2015   Overview:  Biopsy 2014.    . Nonspecific abnormal findings on radiological and examination of skull  and head 07/09/2013  . Osteoarthritis 06/23/2015  . Osteopenia 06/23/2015  . PONV (postoperative nausea and vomiting)   . Right bundle branch block 06/23/2015  . Senile purpura (HCC) 01/25/2016  . Spinal stenosis of lumbar region 02/08/2013  . Spinal stenosis, lumbar region, with neurogenic claudication 02/08/2013  . Thrombocytopenia (HCC) 06/23/2015  . Trigeminal neuralgia 07/09/2013  . Vitamin B12 deficiency 06/23/2015    Past Surgical History:  Procedure Laterality Date  . BACK SURGERY     1983  . BACK SURGERY  11/2017   Put "cement" in 2 places in back due to broken bones.  Marland Kitchen CATARACT EXTRACTION W/ INTRAOCULAR LENS  IMPLANT, BILATERAL    . LUMBAR LAMINECTOMY Right 02/08/2013   Procedure: Right L3-4, L4-5 Hemi-laminectomy, decompression right L3,L4 and L5 nerve roots;  Surgeon: Kerrin Champagne, MD;  Location: [facility] OR;  Service: Orthopedics;  Laterality: Right;  Marland Kitchen VERTEBROPLASTY      Current Medications: No outpatient medications have been marked as taking for the 05/28/20 encounter (Appointment) with Baldo Daub, MD.     Allergies:   Neuromuscular blocking agents and Statins   Social History   Socioeconomic History  . Marital status: Widowed    Spouse name: Not on file  . Number of children: Not on file  . Years of education: Not on file  . Highest education level: Not on file  Occupational History  . Not on file  Tobacco Use  . Smoking status: Never Smoker  . Smokeless tobacco:  Never Used  Vaping Use  . Vaping Use: Never used  Substance and Sexual Activity  . Alcohol use: No  . Drug use: No  . Sexual activity: Not on file  Other Topics Concern  . Not on file  Social History Narrative  . Not on file   Social Determinants of Health   Financial Resource Strain: Not on file  Food Insecurity: Not on file  Transportation Needs: Not on file  Physical Activity: Not on file  Stress: Not on file  Social Connections: Not on file     Family History: The patient's ***family  history includes Angina in her mother; Colon cancer in her brother; Heart attack in her mother; Parkinson's disease in her brother; Stroke in her brother and father. ROS:   Please see the history of present illness.    All other systems reviewed and are negative.  EKGs/Labs/Other Studies Reviewed:    The following studies were reviewed today:  EKG:  EKG ordered today and personally reviewed.  The ekg ordered today demonstrates ***  Recent Labs: No results found for requested labs within last 8760 hours.  Recent Lipid Panel No results found for: CHOL, TRIG, HDL, CHOLHDL, VLDL, LDLCALC, LDLDIRECT  Physical Exam:    VS:  There were no vitals taken for this visit.    Wt Readings from Last 3 Encounters:  02/01/18 135 lb (61.2 kg)  08/15/17 140 lb (63.5 kg)  07/06/17 138 lb (62.6 kg)     GEN: *** Well nourished, well developed in no acute distress HEENT: Normal NECK: No JVD; No carotid bruits LYMPHATICS: No lymphadenopathy CARDIAC: ***RRR, no murmurs, rubs, gallops RESPIRATORY:  Clear to auscultation without rales, wheezing or rhonchi  ABDOMEN: Soft, non-tender, non-distended MUSCULOSKELETAL:  No edema; No deformity  SKIN: Warm and dry NEUROLOGIC:  Alert and oriented x 3 PSYCHIATRIC:  Normal affect    Signed, Norman Herrlich, MD  05/26/2020 2:51 PM    [facility]

## 2020-05-28 ENCOUNTER — Ambulatory Visit: Payer: Medicare Other | Admitting: Cardiology

## 2020-10-11 NOTE — Progress Notes (Signed)
Cardiology Office Note:    Date:  10/12/2020   ID:  [name], DOB 1933-07-17, MRN [id number]  PCP:  Gordan Payment., MD  Cardiologist:  Norman Herrlich, MD    Referring MD: Gordan Payment., MD    ASSESSMENT:    1. Coronary artery disease involving native coronary artery of native heart with angina pectoris (HCC)   2. Essential hypertension   3. Hyperlipidemia, unspecified hyperlipidemia type    PLAN:    In order of problems listed above:  1. Continues to do well New [place] Heart Association class I continue beta-blocker aspirin and her over-the-counter fish oil supplement she is statin intolerant. 2. Stable continue current treatment beta-blocker 3. Continue fish oil supplement statin intolerant   Next appointment: 1 year   Medication Adjustments/Labs and Tests Ordered: Current medicines are reviewed at length with the patient today.  Concerns regarding medicines are outlined above.  No orders of the defined types were placed in this encounter.  No orders of the defined types were placed in this encounter.   Chief Complaint  Patient presents with  . Follow-up  . Coronary Artery Disease    History of Present Illness:    Maureen Jensen is a 87 y.o. female with a hx of CAD on cardiac CTA 09/04/2017 calcium score 199 and mild stenosis in the proximal LAD with normal FFR, hypertension and hyperlipidemia last seen 02/01/2018.  Compliance with diet, lifestyle and medications: Yes  Overall doing well very vigorous and active and only real complaint is easy bruising worsened with aspirin.  She has had no mucosal bleeding.  Has not needed to use nitroglycerin no palpitation shortness of breath chest pain or syncope.  She is not on statin   Recent laboratory test [facility] PCP 08/04/2020: CMP with potassium 4.4 sodium 143 creatinine 0.95 GFR 59 cc TSH decrease 0.09 Hemoglobin 13.7 platelets 149,000 Free T4 normal 0.9  Lipid profile 05/06/2020 cholesterol  163 LDL 49 triglycerides 100 HDL 64 non-HDL cholesterol 99 Past Medical History:  Diagnosis Date  . Abnormal EKG 06/15/2017  . 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 Chest pain, atypical 06/15/2017  . Essential hypertension 06/25/2015  . Hoarseness 09/22/2015   Last Assessment & Plan:  Hoarse on 20 mg of omeprazole. Would like to try 40 mg for one month. Will call with results.  Electronically signed by: Alphonzo Dublin, PA-C 10/15/15 [phone]  . Hyperlipidemia 06/23/2015  . Hyperthyroidism 06/23/2015   Overview:  Subclinical.  Using atenolol.  . Medical history non-contributory   . Multiple thyroid nodules 06/23/2015   Overview:  Biopsy 2014.    . Nonspecific abnormal findings on radiological and examination of skull and head 07/09/2013  . Osteoarthritis 06/23/2015  . Osteopenia 06/23/2015  . PONV (postoperative nausea and vomiting)   . Right bundle branch block 06/23/2015  . Senile purpura (HCC) 01/25/2016  . Spinal stenosis of lumbar region 02/08/2013  . Spinal stenosis, lumbar region, with neurogenic claudication 02/08/2013  . Thrombocytopenia (HCC) 06/23/2015  . Trigeminal neuralgia 07/09/2013  . Vitamin B12 deficiency 06/23/2015    Past Surgical History:  Procedure Laterality Date  . BACK SURGERY     1983  . BACK SURGERY  11/2017   Put "cement" in 2 places in back due to broken bones.  Marland Kitchen CATARACT EXTRACTION W/ INTRAOCULAR LENS  IMPLANT, BILATERAL    . LUMBAR LAMINECTOMY Right 02/08/2013   Procedure: Right L3-4, L4-5 Hemi-laminectomy, decompression right L3,L4 and  L5 nerve roots;  Surgeon: Kerrin Champagne, MD;  Location: [facility] OR;  Service: Orthopedics;  Laterality: Right;  Marland Kitchen VERTEBROPLASTY      Current Medications: Current Meds  Medication Sig  . acetaminophen (TYLENOL) 325 MG tablet Take 650 mg by mouth every 6 (six) hours as needed for moderate pain.  Marland Kitchen alendronate (FOSAMAX) 70 MG tablet Take 1 tablet by mouth once a week.  Marland Kitchen amLODipine (NORVASC) 5 MG tablet TAKE  1 TABLET (5 MG TOTAL) BY MOUTH EVERY OTHER DAY. *PLEASE SCHEDULE FOLLOW UP FOR FURTHER REFILLS*  . aspirin 81 MG tablet Take 81 mg by mouth daily.  Marland Kitchen atenolol (TENORMIN) 25 MG tablet Take 25 mg by mouth daily.  . cyanocobalamin (,VITAMIN B-12,) 1000 MCG/ML injection Inject into the muscle every 30 (thirty) days.  . fluticasone (FLONASE) 50 MCG/ACT nasal spray Place 1 spray into both nostrils daily as needed for allergies.  Marland Kitchen gabapentin (NEURONTIN) 300 MG capsule TAKE 1 CAPSULE TWICE DAILY  . Multiple Vitamins-Minerals (MULTIVITAMIN WITH MINERALS) tablet Take 1 tablet by mouth daily.  . nitroGLYCERIN (NITROSTAT) 0.4 MG SL tablet Place 1 tablet (0.4 mg total) under the tongue every 5 (five) minutes as needed for chest pain.  . Omega-3 Fat Ac-Cholecalciferol (OCEAN BLUE MINICAPS D/OMEGA3) [phone] MG-UNIT CAPS Take 1 capsule by mouth daily.     Allergies:   Neuromuscular blocking agents and Statins   Social History   Socioeconomic History  . Marital status: Widowed    Spouse name: Not on file  . Number of children: Not on file  . Years of education: Not on file  . Highest education level: Not on file  Occupational History  . Not on file  Tobacco Use  . Smoking status: Never Smoker  . Smokeless tobacco: Never Used  Vaping Use  . Vaping Use: Never used  Substance and Sexual Activity  . Alcohol use: No  . Drug use: No  . Sexual activity: Not on file  Other Topics Concern  . Not on file  Social History Narrative  . Not on file   Social Determinants of Health   Financial Resource Strain: Not on file  Food Insecurity: Not on file  Transportation Needs: Not on file  Physical Activity: Not on file  Stress: Not on file  Social Connections: Not on file     Family History: The patient's family history includes Angina in her mother; Colon cancer in her brother; Heart attack in her mother; Parkinson's disease in her brother; Stroke in her brother and father. ROS:   Please see the  history of present illness.    All other systems reviewed and are negative.  EKGs/Labs/Other Studies Reviewed:    The following studies were reviewed today:  EKG:  EKG ordered today and personally reviewed.  The ekg ordered today demonstrates sinus rhythm incomplete right bundle branch block    Physical Exam:    VS:  BP 118/60 (BP Location: Right Arm, Patient Position: Sitting, Cuff Size: Normal)   Pulse (!) 55   Ht 5\' 3"  (1.6 m)   Wt 130 lb 3.2 oz (59.1 kg)   SpO2 96%   BMI 23.06 kg/m     Wt Readings from Last 3 Encounters:  10/12/20 130 lb 3.2 oz (59.1 kg)  02/01/18 135 lb (61.2 kg)  08/15/17 140 lb (63.5 kg)     GEN:  Well nourished, well developed in no acute distress HEENT: Normal NECK: No JVD; No carotid bruits LYMPHATICS: No lymphadenopathy CARDIAC: RRR, no  murmurs, rubs, gallops RESPIRATORY:  Clear to auscultation without rales, wheezing or rhonchi  ABDOMEN: Soft, non-tender, non-distended MUSCULOSKELETAL:  No edema; No deformity  SKIN: Warm and dry NEUROLOGIC:  Alert and oriented x 3 PSYCHIATRIC:  Normal affect    Signed, Norman Herrlich, MD  10/12/2020 1:23 PM    [facility]

## 2020-10-12 ENCOUNTER — Encounter: Payer: Self-pay | Admitting: Cardiology

## 2020-10-12 ENCOUNTER — Other Ambulatory Visit: Payer: Self-pay

## 2020-10-12 ENCOUNTER — Ambulatory Visit (INDEPENDENT_AMBULATORY_CARE_PROVIDER_SITE_OTHER): Payer: Medicare Other | Admitting: Cardiology

## 2020-10-12 VITALS — BP 118/60 | HR 55 | Ht 63.0 in | Wt 130.2 lb

## 2020-10-12 DIAGNOSIS — E785 Hyperlipidemia, unspecified: Secondary | ICD-10-CM | POA: Diagnosis not present

## 2020-10-12 DIAGNOSIS — I10 Essential (primary) hypertension: Secondary | ICD-10-CM

## 2020-10-12 DIAGNOSIS — I25119 Atherosclerotic heart disease of native coronary artery with unspecified angina pectoris: Secondary | ICD-10-CM | POA: Diagnosis not present

## 2020-10-12 NOTE — Patient Instructions (Signed)
Medication Instructions:  Your physician recommends that you continue on your current medications as directed. Please refer to the Current Medication list given to you today.  *If you need a refill on your cardiac medications before your next appointment, please call your pharmacy*   Lab Work: None If you have labs (blood work) drawn today and your tests are completely normal, you will receive your results only by: MyChart Message (if you have MyChart) OR A paper copy in the mail If you have any lab test that is abnormal or we need to change your treatment, we will call you to review the results.   Testing/Procedures: None   Follow-Up: At [facility], you and your health needs are our priority.  As part of our continuing mission to provide you with exceptional heart care, we have created designated Provider Care Teams.  These Care Teams include your primary Cardiologist (physician) and Advanced Practice Providers (APPs -  Physician Assistants and Nurse Practitioners) who all work together to provide you with the care you need, when you need it.  We recommend signing up for the patient portal called "MyChart".  Sign up information is provided on this After Visit Summary.  MyChart is used to connect with patients for Virtual Visits (Telemedicine).  Patients are able to view lab/test results, encounter notes, upcoming appointments, etc.  Non-urgent messages can be sent to your provider as well.   To learn more about what you can do with MyChart, go to [email or web address].    Your next appointment:   1 year  The format for your next appointment:   In Person  Provider:   Brian Munley, MD    Other Instructions   

## 2021-07-27 DIAGNOSIS — Z8669 Personal history of other diseases of the nervous system and sense organs: Secondary | ICD-10-CM | POA: Insufficient documentation

## 2021-07-29 DIAGNOSIS — R7303 Prediabetes: Secondary | ICD-10-CM

## 2021-07-29 HISTORY — DX: Prediabetes: R73.03

## 2021-09-16 DIAGNOSIS — H68102 Unspecified obstruction of Eustachian tube, left ear: Secondary | ICD-10-CM

## 2021-09-16 HISTORY — DX: Unspecified obstruction of eustachian tube, left ear: H68.102

## 2021-11-01 DIAGNOSIS — M542 Cervicalgia: Secondary | ICD-10-CM

## 2021-11-01 HISTORY — DX: Cervicalgia: M54.2

## 2022-02-03 ENCOUNTER — Ambulatory Visit: Payer: Medicare Other | Admitting: Cardiology

## 2022-03-10 ENCOUNTER — Encounter: Payer: Self-pay | Admitting: Cardiology

## 2022-03-10 ENCOUNTER — Ambulatory Visit: Payer: Medicare Other | Attending: Cardiology | Admitting: Cardiology

## 2022-03-10 VITALS — BP 116/64 | HR 64 | Ht 62.0 in | Wt 126.2 lb

## 2022-03-10 DIAGNOSIS — E785 Hyperlipidemia, unspecified: Secondary | ICD-10-CM | POA: Diagnosis not present

## 2022-03-10 DIAGNOSIS — I10 Essential (primary) hypertension: Secondary | ICD-10-CM | POA: Insufficient documentation

## 2022-03-10 DIAGNOSIS — I25119 Atherosclerotic heart disease of native coronary artery with unspecified angina pectoris: Secondary | ICD-10-CM | POA: Insufficient documentation

## 2022-03-10 NOTE — Patient Instructions (Signed)
Medication Instructions:  Your physician recommends that you continue on your current medications as directed. Please refer to the Current Medication list given to you today.  *If you need a refill on your cardiac medications before your next appointment, please call your pharmacy*   Lab Work: NONE If you have labs (blood work) drawn today and your tests are completely normal, you will receive your results only by: MyChart Message (if you have MyChart) OR A paper copy in the mail If you have any lab test that is abnormal or we need to change your treatment, we will call you to review the results.   Testing/Procedures: NONE   Follow-Up: At [facility], you and your health needs are our priority.  As part of our continuing mission to provide you with exceptional heart care, we have created designated Provider Care Teams.  These Care Teams include your primary Cardiologist (physician) and Advanced Practice Providers (APPs -  Physician Assistants and Nurse Practitioners) who all work together to provide you with the care you need, when you need it.  We recommend signing up for the patient portal called "MyChart".  Sign up information is provided on this After Visit Summary.  MyChart is used to connect with patients for Virtual Visits (Telemedicine).  Patients are able to view lab/test results, encounter notes, upcoming appointments, etc.  Non-urgent messages can be sent to your provider as well.   To learn more about what you can do with MyChart, go to [email or web address].    Your next appointment:   1 year(s)  The format for your next appointment:   In Person  Provider:   Brian Munley, MD    Other Instructions   Important Information About Sugar       

## 2022-03-10 NOTE — Progress Notes (Signed)
Cardiology Office Note:    Date:  03/10/2022   ID:  Maureen Jensen, DOB August 28, 1933, MRN [id number]  PCP:  Raina Mina., MD  Cardiologist:  Shirlee More, MD    Referring MD: Raina Mina., MD    ASSESSMENT:    1. Coronary artery disease involving native coronary artery of native heart with angina pectoris ([place])   2. Essential hypertension   3. Hyperlipidemia, unspecified hyperlipidemia type    PLAN:    In order of problems listed above:  Stable having no angina continue treatment including aspirin beta-blocker amlodipine she is statin intolerant and presently on fish oil. Well-controlled continue current treatment Statin intolerant LDL is good on fish oil continue the same   Next appointment: 1 year   Medication Adjustments/Labs and Tests Ordered: Current medicines are reviewed at length with the patient today.  Concerns regarding medicines are outlined above.  Orders Placed This Encounter  Procedures   EKG 12-Lead   No orders of the defined types were placed in this encounter.   Chief complaint follow-up CAD   History of Present Illness:    Maureen Jensen is a 88 y.o. female with a hx of CAD with coronary calcium score of 199 and mild stenosis proximal LAD with normal FFR and CTA April 2019 hypertension and hyper lipidemia last seen 10/12/2020.  Compliance with diet, lifestyle and medications: Yes  She had a recent kyphoplasty with a good symptomatic response for her spine pain She is worried about her son who has cardiomyopathy. She is not having angina edema shortness of breath palpitation or syncope She is statin intolerant on fish oil.  02/22/2022: CBC hemoglobin 11.3 CMP normal except for mildly elevated alkaline phosphatase 106, sodium 139 potassium 4.8 creatinine 0.82 Lipid profile 07/27/2021 cholesterol 151 LDL 46 triglycerides 86 HDL 69 and non-HDL cholesterol 82  Past Medical History:  Diagnosis Date   Abnormal EKG 06/15/2017   Brachial  neuritis 07/09/2013   Overview:  IMPRESSION: right C4   Chest pain, atypical 06/15/2017   Essential hypertension 06/25/2015   Hoarseness 09/22/2015   Last Assessment & Plan:  Hoarse on 20 mg of omeprazole. Would like to try 40 mg for one month. Will call with results.  Electronically signed by: Hansel Feinstein, PA-C 10/15/15 [phone]   Hyperlipidemia 06/23/2015   Hyperthyroidism 06/23/2015   Overview:  Subclinical.  Using atenolol.   Medical history non-contributory    Multiple thyroid nodules 06/23/2015   Overview:  Biopsy 2014.     Nonspecific abnormal findings on radiological and examination of skull and head 07/09/2013   Osteoarthritis 06/23/2015   Osteopenia 06/23/2015   PONV (postoperative nausea and vomiting)    Right bundle branch block 06/23/2015   Senile purpura ([place]) 01/25/2016   Spinal stenosis of lumbar region 02/08/2013   Spinal stenosis, lumbar region, with neurogenic claudication 02/08/2013   Thrombocytopenia ([place]) 06/23/2015   Trigeminal neuralgia 07/09/2013   Vitamin B12 deficiency 06/23/2015    Past Surgical History:  Procedure Laterality Date   [place]  11/2017   Put "cement" in 2 places in back due to broken bones.   CATARACT EXTRACTION W/ INTRAOCULAR LENS  IMPLANT, BILATERAL     LUMBAR LAMINECTOMY Right 02/08/2013   Procedure: Right L3-4, L4-5 Hemi-laminectomy, decompression right L3,L4 and L5 nerve roots;  Surgeon: Jessy Oto, MD;  Location: [place];  Service: Orthopedics;  Laterality: Right;   VERTEBROPLASTY  Current Medications: Current Meds  Medication Sig   acetaminophen (TYLENOL) 325 MG tablet Take 650 mg by mouth every 6 (six) hours as needed for moderate pain.   alendronate (FOSAMAX) 70 MG tablet Take 1 tablet by mouth once a week.   amLODipine (NORVASC) 5 MG tablet TAKE 1 TABLET (5 MG TOTAL) BY MOUTH EVERY OTHER DAY. *PLEASE SCHEDULE FOLLOW UP FOR FURTHER REFILLS*   aspirin 81 MG tablet Take 81 mg by mouth daily.   atenolol  (TENORMIN) 25 MG tablet Take 25 mg by mouth daily.   cyanocobalamin (,VITAMIN B-12,) 1000 MCG/ML injection Inject into the muscle every 30 (thirty) days.   fluticasone (FLONASE) 50 MCG/ACT nasal spray Place 1 spray into both nostrils daily as needed for allergies.   gabapentin (NEURONTIN) 300 MG capsule TAKE 1 CAPSULE TWICE DAILY   Multiple Vitamins-Minerals (MULTIVITAMIN WITH MINERALS) tablet Take 1 tablet by mouth daily.   nitroGLYCERIN (NITROSTAT) 0.4 MG SL tablet Place 1 tablet (0.4 mg total) under the tongue every 5 (five) minutes as needed for chest pain.   Omega-3 Fat Ac-Cholecalciferol (OCEAN BLUE MINICAPS D/OMEGA3) [phone] MG-UNIT CAPS Take 1 capsule by mouth daily.     Allergies:   Neuromuscular blocking agents and Statins   Social History   Socioeconomic History   Marital status: Widowed    Spouse name: Not on file   Number of children: Not on file   Years of education: Not on file   Highest education level: Not on file  Occupational History   Not on file  Tobacco Use   Smoking status: Never   Smokeless tobacco: Never  Vaping Use   Vaping Use: Never used  Substance and Sexual Activity   Alcohol use: No   Drug use: No   Sexual activity: Not on file  Other Topics Concern   Not on file  Social History Narrative   Not on file   Social Determinants of Health   Financial Resource Strain: Not on file  Food Insecurity: Not on file  Transportation Needs: Not on file  Physical Activity: Not on file  Stress: Not on file  Social Connections: Not on file     Family History: The patient's family history includes Angina in her mother; Colon cancer in her brother; Heart attack in her mother; Parkinson's disease in her brother; Stroke in her brother and father. ROS:   Please see the history of present illness.    All other systems reviewed and are negative.  EKGs/Labs/Other Studies Reviewed:    The following studies were reviewed today:  EKG:  EKG ordered today and  personally reviewed.  The ekg ordered today demonstrates sinus rhythm normal EKG   Physical Exam:    VS:  BP 116/64 (BP Location: Right Arm, Patient Position: Sitting, Cuff Size: Normal)   Pulse 64   Ht 5\' 2"  (1.575 m)   Wt 126 lb 3.2 oz (57.2 kg)   SpO2 96%   BMI 23.08 kg/m     Wt Readings from Last 3 Encounters:  03/10/22 126 lb 3.2 oz (57.2 kg)  10/12/20 130 lb 3.2 oz (59.1 kg)  02/01/18 135 lb (61.2 kg)     GEN:  Well nourished, well developed in no acute distress HEENT: Normal NECK: No JVD; No carotid bruits LYMPHATICS: No lymphadenopathy CARDIAC: RRR, no murmurs, rubs, gallops RESPIRATORY:  Clear to auscultation without rales, wheezing or rhonchi  ABDOMEN: Soft, non-tender, non-distended MUSCULOSKELETAL:  No edema; No deformity  SKIN: Warm and dry NEUROLOGIC:  Alert  and oriented x 3 PSYCHIATRIC:  Normal affect    Signed, Shirlee More, MD  03/10/2022 5:01 PM    [place]

## 2022-10-13 DIAGNOSIS — D649 Anemia, unspecified: Secondary | ICD-10-CM | POA: Insufficient documentation

## 2023-01-31 DIAGNOSIS — B0229 Other postherpetic nervous system involvement: Secondary | ICD-10-CM

## 2023-01-31 HISTORY — DX: Other postherpetic nervous system involvement: B02.29

## 2023-05-18 NOTE — Progress Notes (Signed)
 Cardiology Office Note:    Date:  05/22/2023   ID:  Maureen Jensen, DOB 09/27/1933, MRN [id number]  PCP:  Maureen Cathlyn LABOR., MD  Cardiologist:  Redell Leiter, MD    Referring MD: Maureen Cathlyn LABOR., MD    ASSESSMENT:    1. Coronary artery disease involving native coronary artery of native heart with angina pectoris (HCC)   2. Mixed hyperlipidemia   3. Essential hypertension   4. Right bundle branch block    PLAN:    In order of problems listed above:  From cardiology perspective doing well no anginal discomfort she will continue treatment with low-dose aspirin  not taking a statin continue over-the-counter fish oil Blood pressure well-controlled on current regimen including calcium channel blocker EKG is stable today I do not see a right bundle branch block Unfortunately has chronic pain syndrome and she will contact her back specialist   Next appointment: 1 year   Medication Adjustments/Labs and Tests Ordered: Current medicines are reviewed at length with the patient today.  Concerns regarding medicines are outlined above.  Orders Placed This Encounter  Procedures   EKG 12-Lead   No orders of the defined types were placed in this encounter.    History of Present Illness:    Maureen Jensen is a 89 y.o. female with a hx of CAD with an elevated coronary calcium score 199 mild stenosis proximal LAD on CTA in April 2019 right bundle branch block hypertension and hyper lipidemia last seen 02/07/2022.  Recent labs [facility] 01/31/2023 sodium 140 potassium 4.3 creatinine 0.75 GFR 77 cc/min 603 2024 cholesterol 146 LDL 55 non-HDL cholesterol 72 triglycerides 89  Compliance with diet, lifestyle and medications: Yes  Unfortunately she does not feel well she has chronic back pain severe kyphosis and pain has become constant despite her gabapentin  and Tylenol  it is clearly positional when she bends over with upright posture sharp in nature radiates under the lower ribs from  the back and sharp in nature not anginal No edema shortness of breath orthopnea angina palpitation or syncope Past Medical History:  Diagnosis Date   Abnormal EKG 06/15/2017   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 Would like to try 40 mg for one month. Will call with results.  Electronically signed by: Odella Earnie Duverney, PA-C 10/15/15 [phone]   Hyperlipidemia 06/23/2015   Hyperthyroidism 06/23/2015   Overview:  Subclinical.  Using atenolol.   Medical history non-contributory    Multiple thyroid nodules 06/23/2015   Overview:  Biopsy 2014.     Nonspecific abnormal findings on radiological and examination of skull and head 07/09/2013   Osteoarthritis 06/23/2015   Osteopenia 06/23/2015   PONV (postoperative nausea and vomiting)    Right bundle branch block 06/23/2015   Senile purpura (HCC) 01/25/2016   Spinal stenosis of lumbar region 02/08/2013   Spinal stenosis, lumbar region, with neurogenic claudication 02/08/2013   Thrombocytopenia (HCC) 06/23/2015   Trigeminal neuralgia 07/09/2013   Vitamin B12 deficiency 06/23/2015    Current Medications: Current Meds  Medication Sig   acetaminophen  (TYLENOL ) 325 MG tablet Take 650 mg by mouth every 6 (six) hours as needed for moderate pain.   alendronate (FOSAMAX) 70 MG tablet Take 1 tablet by mouth once a week.   amLODipine  (NORVASC ) 5 MG tablet TAKE 1 TABLET (5 MG TOTAL)  BY MOUTH EVERY OTHER DAY. *PLEASE SCHEDULE FOLLOW UP FOR FURTHER REFILLS*   aspirin  81 MG tablet Take 81 mg by mouth daily.   atenolol (TENORMIN) 25 MG tablet Take 25 mg by mouth daily.   cetirizine (ZYRTEC) 10 MG tablet Take 10 mg by mouth daily.   cyanocobalamin (,VITAMIN B-12,) 1000 MCG/ML injection Inject into the muscle every 30 (thirty) days.   fluticasone (FLONASE) 50 MCG/ACT nasal spray Place 1 spray into both  nostrils daily as needed for allergies.   gabapentin  (NEURONTIN ) 300 MG capsule TAKE 1 CAPSULE TWICE DAILY   Multiple Vitamins-Minerals (MULTIVITAMIN WITH MINERALS) tablet Take 1 tablet by mouth daily.   Omega-3 Fat Ac-Cholecalciferol  (OCEAN BLUE MINICAPS D/OMEGA3) [phone] MG-UNIT CAPS Take 1 capsule by mouth daily.      EKGs/Labs/Other Studies Reviewed:    The following studies were reviewed today:  EKG Interpretation Date/Time:  Monday May 22 2023 09:34:37 EST Ventricular Rate:  84 PR Interval:  178 QRS Duration:  78 QT Interval:  404 QTC Calculation: 477 R Axis:   -28  Text Interpretation: Normal sinus rhythm Nonspecific ST and T wave abnormality When compared with ECG of 19-Feb-2002 03:15, Nonspecific T wave abnormality has replaced inverted T waves in Anterior leads Confirmed by Monetta Rogue ([id number]) on 05/22/2023 9:35:38 AM   Recent Labs: 01/31/2023 hemoglobin 12.5 platelets 159,000 CMP normal creatinine 0.75 GFR 77 cc sodium 1.3 and normal liver function test 10/10/2022 cholesterol 146 LDL 55 non-HDL cholesterol 72 Physical Exam:    VS:  BP 132/72   Pulse 84   Ht 5' 1 (1.549 m)   Wt 121 lb 9.6 oz (55.2 kg)   SpO2 98%   BMI 22.98 kg/m     Wt Readings from Last 3 Encounters:  05/22/23 121 lb 9.6 oz (55.2 kg)  03/10/22 126 lb 3.2 oz (57.2 kg)  10/12/20 130 lb 3.2 oz (59.1 kg)     GEN: She looks very frail she has a severe kyphosis  in no acute distress HEENT: Normal NECK: No JVD; No carotid bruits LYMPHATICS: No lymphadenopathy CARDIAC: RRR, no murmurs, rubs, gallops RESPIRATORY:  Clear to auscultation without rales, wheezing or rhonchi  ABDOMEN: Soft, non-tender, non-distended MUSCULOSKELETAL:  No edema; No deformity  SKIN: Warm and dry NEUROLOGIC:  Alert and oriented x 3 PSYCHIATRIC:  Normal affect    Signed, Rogue Monetta, MD  05/22/2023 9:47 AM    [facility]

## 2023-05-22 ENCOUNTER — Encounter: Payer: Self-pay | Admitting: Cardiology

## 2023-05-22 ENCOUNTER — Ambulatory Visit: Payer: Medicare Other | Attending: Cardiology | Admitting: Cardiology

## 2023-05-22 VITALS — BP 132/72 | HR 84 | Ht 61.0 in | Wt 121.6 lb

## 2023-05-22 DIAGNOSIS — I10 Essential (primary) hypertension: Secondary | ICD-10-CM | POA: Insufficient documentation

## 2023-05-22 DIAGNOSIS — E782 Mixed hyperlipidemia: Secondary | ICD-10-CM | POA: Insufficient documentation

## 2023-05-22 DIAGNOSIS — I4510 Unspecified right bundle-branch block: Secondary | ICD-10-CM | POA: Diagnosis not present

## 2023-05-22 DIAGNOSIS — I25119 Atherosclerotic heart disease of native coronary artery with unspecified angina pectoris: Secondary | ICD-10-CM | POA: Diagnosis not present

## 2023-05-22 NOTE — Patient Instructions (Signed)

## 2023-10-16 ENCOUNTER — Ambulatory Visit (HOSPITAL_BASED_OUTPATIENT_CLINIC_OR_DEPARTMENT_OTHER)
Admission: RE | Admit: 2023-10-16 | Discharge: 2023-10-16 | Disposition: A | Discharge status: Home / Self Care | Source: Ambulatory Visit

## 2023-10-16 ENCOUNTER — Encounter (HOSPITAL_BASED_OUTPATIENT_CLINIC_OR_DEPARTMENT_OTHER): Payer: Self-pay

## 2023-10-16 VITALS — BP 134/66 | HR 55 | Temp 98.4°F | Resp 18

## 2023-10-16 DIAGNOSIS — I87332 Chronic venous hypertension (idiopathic) with ulcer and inflammation of left lower extremity: Secondary | ICD-10-CM

## 2023-10-16 DIAGNOSIS — L03116 Cellulitis of left lower limb: Secondary | ICD-10-CM | POA: Diagnosis not present

## 2023-10-16 MED ORDER — MUPIROCIN 2 % EX OINT
1.0000 | TOPICAL_OINTMENT | Freq: Two times a day (BID) | CUTANEOUS | 0 refills | Status: AC
  Filled 2023-10-16: qty 22, 11d supply, fill #0

## 2023-10-16 MED ORDER — CLINDAMYCIN HCL 300 MG PO CAPS
300.0000 mg | ORAL_CAPSULE | Freq: Three times a day (TID) | ORAL | 0 refills | Status: AC
  Filled 2023-10-16: qty 30, 10d supply, fill #0

## 2023-10-16 NOTE — Discharge Instructions (Addendum)
 Bilateral lower legs with chronic stasis dermatitis.  Would benefit from seeing a specialist to improve circulation.  Have offered referral to surgical group in [place].  Patient will be contacted but is not obligated to be seen.  Cellulitis of left lower leg: Infection secondary to injury approximately 30 days ago.  She has been using topical mupirocin with poor or little improvement.  Clindamycin 300 mg twice daily for 10 days.  Clean it with warm soapy fingers, rinse, pat dry, apply mupirocin ointment.  Needs to follow-up with her PCP or with the group in [place].  Return here as needed.

## 2023-10-16 NOTE — ED Provider Notes (Signed)
 [facility]    CSN: [id number] Arrival date & time: 10/16/23  1713      History   Chief Complaint Chief Complaint  Patient presents with   Leg Injury    left leg has a bruised area, red and swollen with a small open wound. Possibly need antibiotics - Entered by patient    HPI Maureen Jensen is a 89 y.o. female.   The patient has chronic stasis dermatitis for years.  She bumped her left lower leg approximately 09/07/23 or 09/08/23.  She has had a known healing wound since that time.  She presented dermatology for something else and medicated for mupirocin ointment and she has used that for several weeks without improvement.  She came here because the family member thought it might be infected and needed more medication.     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 Would like to try 40 mg for one month. Will call with results.  Electronically signed by: Ellouise Haagensen, PA-C 10/15/15 [phone]   Hyperlipidemia 06/23/2015   Hyperthyroidism 06/23/2015   Overview:  Subclinical.  Using atenolol.   Medical history non-contributory    Multiple thyroid nodules 06/23/2015   Overview:  Biopsy 2014.     Nonspecific abnormal findings on radiological and examination of skull and head 07/09/2013   Osteoarthritis 06/23/2015   Osteopenia 06/23/2015   PONV (postoperative nausea and vomiting)    Right bundle branch block 06/23/2015   Senile purpura (HCC) 01/25/2016   Spinal stenosis of lumbar region 02/08/2013   Spinal stenosis, lumbar region, with neurogenic claudication 02/08/2013   Thrombocytopenia (HCC) 06/23/2015   Trigeminal neuralgia 07/09/2013   Vitamin B12 deficiency 06/23/2015    Patient Active Problem List   Diagnosis Date Noted   Post herpetic  neuralgia 01/31/2023   Anemia of unknown etiology 10/13/2022   Posterior neck pain 11/01/2021   Blocked Eustachian tube, left 09/16/2021   Prediabetes 07/29/2021   History of Guillain-Barre syndrome 07/27/2021   PONV (postoperative nausea and vomiting)    Medical history non-contributory    Chronic rhinitis 10/31/2019   Compression fracture of T9 vertebra (HCC) 11/21/2017   Coronary artery disease involving native coronary artery of native heart with angina pectoris (HCC) 07/05/2017   Abnormal EKG 06/15/2017   Chest pain, atypical 06/15/2017   Senile purpura (HCC) 01/25/2016   Otalgia, left 10/15/2015   Hoarseness 09/22/2015   Essential hypertension 06/25/2015   Hyperlipidemia 06/23/2015   Hyperthyroidism 06/23/2015   Multiple thyroid nodules 06/23/2015   Osteoarthritis 06/23/2015   Osteopenia 06/23/2015   Right bundle branch block 06/23/2015   Thrombocytopenia (HCC) 06/23/2015   Vitamin B12 deficiency 06/23/2015   Age-related osteoporosis with current pathological fracture with routine healing 06/23/2015   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   CATARACT EXTRACTION  W/ INTRAOCULAR LENS  IMPLANT, BILATERAL     LUMBAR LAMINECTOMY Right 02/08/2013   Procedure: Right L3-4, L4-5 Hemi-laminectomy, decompression right L3,L4 and L5 nerve roots;  Surgeon: Alphonso Jean, MD;  Location: [facility] OR;  Service: Orthopedics;  Laterality: Right;   VERTEBROPLASTY      OB History   No obstetric history on file.      Home Medications    Prior to Admission medications   Medication  Sig Start Date End Date Taking? Authorizing Provider  amLODipine  (NORVASC ) 5 MG tablet TAKE 1 TABLET (5 MG TOTAL) BY MOUTH EVERY OTHER DAY. *PLEASE SCHEDULE FOLLOW UP FOR FURTHER REFILLS* 04/30/19  Yes Hassan Links, MD  aspirin  81 MG tablet Take 81 mg by mouth daily.   Yes [provider]  atenolol (TENORMIN) 25 MG tablet Take 25 mg by mouth daily. 04/09/17  Yes [provider]  cetirizine (ZYRTEC) 10 MG tablet Take 10 mg by mouth daily. 09/16/21  Yes [provider]  clindamycin (CLEOCIN) 300 MG capsule Take 1 capsule (300 mg total) by mouth 3 (three) times daily for 10 days. 10/16/23 10/26/23 Yes Guss Legacy, FNP  cyanocobalamin (,VITAMIN B-12,) 1000 MCG/ML injection Inject into the muscle every 30 (thirty) days. 10/31/19 11/24/60 Yes [provider]  gabapentin  (NEURONTIN ) 300 MG capsule TAKE 1 CAPSULE TWICE DAILY 11/06/15  Yes [provider]  mupirocin ointment (BACTROBAN) 2 % Apply 1 Application topically 2 (two) times daily. 10/16/23  Yes Guss Legacy, FNP  acetaminophen  (TYLENOL ) 325 MG tablet Take 650 mg by mouth every 6 (six) hours as needed for moderate pain.    [provider]  alendronate (FOSAMAX) 70 MG tablet Take 1 tablet by mouth once a week. 06/02/16   [provider]  fluticasone (FLONASE) 50 MCG/ACT nasal spray Place 1 spray into both nostrils daily as needed for allergies. 07/31/20   [provider]  Multiple Vitamins-Minerals (MULTIVITAMIN WITH MINERALS) tablet Take 1 tablet by mouth daily.    [provider]  nitroGLYCERIN  (NITROSTAT ) 0.4 MG SL tablet Place 1 tablet (0.4 mg total) under the tongue every 5 (five) minutes as needed for chest pain. 07/06/17 03/10/22  Hassan Links, MD  Omega-3 Fat Ac-Cholecalciferol  (OCEAN BLUE MINICAPS D/OMEGA3) [phone] MG-UNIT CAPS Take 1 capsule by mouth daily.    [provider]    Family History Family History  Problem Relation Age of Onset   Colon cancer  Brother    Stroke Brother    Parkinson's disease Brother    Heart attack Mother    Angina Mother    Stroke Father     Social History Social History   Tobacco Use   Smoking status: Never   Smokeless tobacco: Never  Vaping Use   Vaping status: Never Used  Substance Use Topics   Alcohol use: No   Drug use: No     Allergies   Neuromuscular blocking agents and Statins   Review of Systems Review of Systems   Physical Exam Triage Vital Signs ED Triage Vitals  Encounter Vitals Group     BP 10/16/23 1741 134/66     Systolic BP Percentile --      Diastolic BP Percentile --      Pulse Rate 10/16/23 1741 (!) 55     Resp 10/16/23 1741 18     Temp 10/16/23 1741 98.4 F (36.9 C)     Temp Source 10/16/23 1741 Oral     SpO2 10/16/23 1741 94 %  Weight --      Height --      Head Circumference --      Peak Flow --      Pain Score 10/16/23 1738 0     Pain Loc --      Pain Education --      Exclude from Growth Chart --    No data found.  Updated Vital Signs BP 134/66 (BP Location: Right Arm)   Pulse (!) 55   Temp 98.4 F (36.9 C) (Oral)   Resp 18   SpO2 94%   Visual Acuity Right Eye Distance:   Left Eye Distance:   Bilateral Distance:    Right Eye Near:   Left Eye Near:    Bilateral Near:     Physical Exam   UC Treatments / Results  Labs (all labs ordered are listed, but only abnormal results are displayed) Comprehensive Metabolic Panel: 01/31/23: Order: [id number] Component Ref Range & Units 8 mo ago  Sodium 136 - 145 mmol/L 140  Potassium 3.5 - 5.1 mmol/L 4.3  Comment: NO VISIBLE HEMOLYSIS  Chloride 98 - 107 mmol/L 103  CO2 21 - 31 mmol/L 26  Anion Gap 6 - 14 mmol/L 11  Glucose, Random 70 - 99 mg/dL 78  Blood Urea Nitrogen (BUN) 7 - 25 mg/dL 16  Creatinine 4.09 - 8.11 mg/dL 9.14  eGFR >78 GN/FAO/1.30Q6 77  Comment: GFR estimated by CKD-EPI equations(NKF 2021).  "Recommend confirmation of Cr-based eGFR by using Cys-based eGFR and  other filtration markers (if applicable) in complex cases and clinical decision-making, as needed."  Albumin 3.5 - 5.7 g/dL 4.2  Total Protein 6.4 - 8.9 g/dL 6.4  Bilirubin, Total 0.3 - 1.0 mg/dL 0.7  Alkaline Phosphatase (ALP) 34 - 104 U/L 87  Aspartate Aminotransferase (AST) 13 - 39 U/L 18  Alanine Aminotransferase (ALT) 7 - 52 U/L 12  Calcium 8.6 - 10.3 mg/dL 9  BUN/Creatinine Ratio   Comment: Creatinine is normal, ratio is not clinically indicated.  Resulting Agency [facility](CLIA# [id number])    EKG   Radiology No results found.  Procedures Procedures (including critical care time)  Medications Ordered in UC Medications - No data to display  Initial Impression / Assessment and Plan / UC Course  I have reviewed the triage vital signs and the nursing notes.  Pertinent labs & imaging results that were available during my care of the patient were reviewed by me and considered in my medical decision making (see chart for details).  Plan of Care: Chronic stasis dermatitis and venous stasis ulcer of left lower leg with cellulitis: Clindamycin 300 mg 3 times daily for 10 days.  Mupirocin ointment topically.  See discharge instructions for patient education.  Patient may benefit from a new study of venous stasis ulcers it is being done at an orthopedic clinic in [place].  Provided that information and they can investigate if they would like to participate.  Encouraged to make an appointment with Dr. Delorise Few for follow-up of her leg cellulitis and stasis dermatitis.  Follow-up here if needed.  I reviewed the plan of care with the patient and/or the patient's guardian.  The patient and/or guardian had time to ask questions and acknowledged that the questions were answered.  I provided instruction on symptoms or reasons to return here or to go to an ER, if symptoms/condition did not improve, worsened or if new symptoms occurred.  Final Clinical  Impressions(s) / UC Diagnoses   Final  diagnoses:  Stasis dermatitis of left lower extremity with venous ulcer due to chronic peripheral venous hypertension (HCC)  Cellulitis of leg, left     Discharge Instructions      Bilateral lower legs with chronic stasis dermatitis.  Would benefit from seeing a specialist to improve circulation.  Have offered referral to surgical group in [place].  Patient will be contacted but is not obligated to be seen.  Cellulitis of left lower leg: Infection secondary to injury approximately 30 days ago.  She has been using topical mupirocin with poor or little improvement.  Clindamycin 300 mg twice daily for 10 days.  Clean it with warm soapy fingers, rinse, pat dry, apply mupirocin ointment.  Needs to follow-up with her PCP or with the group in [place].  Return here as needed.   ED Prescriptions     Medication Sig Dispense Auth. Provider   mupirocin ointment (BACTROBAN) 2 % Apply 1 Application topically 2 (two) times daily. 22 g Guss Legacy, FNP   clindamycin (CLEOCIN) 300 MG capsule Take 1 capsule (300 mg total) by mouth 3 (three) times daily for 10 days. 30 capsule Guss Legacy, FNP      PDMP not reviewed this encounter.   Guss Legacy, FNP 10/16/23 Maureen Jensen

## 2023-10-16 NOTE — ED Triage Notes (Signed)
 Pt reports she bumped her left leg x 1 month ago it developed into a blood blister, the dermatologist prescribed her a antibiotic ointment but area is no better. She has soaked it in epsom salt.

## 2023-10-17 ENCOUNTER — Telehealth: Payer: Self-pay

## 2023-10-17 ENCOUNTER — Other Ambulatory Visit (HOSPITAL_BASED_OUTPATIENT_CLINIC_OR_DEPARTMENT_OTHER): Payer: Self-pay

## 2023-10-17 NOTE — Telephone Encounter (Signed)
 I called and lm on vm to advise of referral and that Dr. Julio Ohm could see her on Monday morning or afternoon. To give a call back and advise when she would be able to come in. Will hold message and try again later.

## 2023-10-17 NOTE — Telephone Encounter (Signed)
 Messaged received for FNP Sarah with [facility]. Saw pt in the office yesterday fro chronic non healing wound and wanted to refer to Dr. Julio Ohm to manage. Has placed pt on ABX and through secure chat confirmed that I would reach out to the pt to sch appt. I called the home number this morning and the phone rang with out answer. I will hold this message and reach out again a little later today to have pt sch to see Dr. Julio Ohm on Monday 10/23/2023

## 2023-10-17 NOTE — Telephone Encounter (Signed)
 Called pt's cell and the vm has not been set up yet. Have left message on home vm. Will hold and try again.

## 2023-10-18 NOTE — Telephone Encounter (Signed)
 I called and sw pt and she said that she has been taking ABX and using ABX ointment and that things are looking better. She said that she could make an appt but she will have to check with her grand daughter first. Her name is Film/video editor. Patient advised that she will call to make appt.

## 2023-10-24 ENCOUNTER — Ambulatory Visit (INDEPENDENT_AMBULATORY_CARE_PROVIDER_SITE_OTHER): Admitting: Orthopedic Surgery

## 2023-10-24 DIAGNOSIS — L97221 Non-pressure chronic ulcer of left calf limited to breakdown of skin: Secondary | ICD-10-CM | POA: Diagnosis not present

## 2023-10-24 DIAGNOSIS — I83022 Varicose veins of left lower extremity with ulcer of calf: Secondary | ICD-10-CM

## 2023-10-30 ENCOUNTER — Encounter: Payer: Self-pay | Admitting: Orthopedic Surgery

## 2023-10-30 NOTE — Progress Notes (Signed)
 Office Visit Note   Patient: Maureen Jensen           Date of Birth: [birth year]           MRN: [id number] Visit Date: 10/24/2023              Requested by: Thurmond Cathlyn LABOR., MD [id number] [place],  [place] [address] PCP: Thurmond Cathlyn LABOR., MD  Chief Complaint  Patient presents with   Left Leg - Wound Check      HPI: Patient is a 89 year old woman who was seen for initial evaluation and referral from urgent care.  Patient has a left lower extremity nonhealing wound that she states been there for several weeks.  She states this was secondary to blunt trauma.  Patient stated started as a bruise and then a blister.  Patient has been prescribed clindamycin  and Bactroban  ointment.  Assessment & Plan: Visit Diagnoses:  1. Venous stasis ulcer of left calf limited to breakdown of skin with varicose veins (HCC)     Plan: Recommended a medium knee-high compression sock.  There is no signs of infection she has good arterial inflow discussed that she can stop the antibiotics.  Follow-Up Instructions: Return in about 4 weeks (around 11/21/2023).   Ortho Exam  Patient is alert, oriented, no adenopathy, well-dressed, normal affect, normal respiratory effort. Examination patient has a good dorsalis pedis pulse.  Left lower extremity patient has brawny edema with a Wagner grade 1 ulcer 1 cm diameter with varicose veins.  There is pitting edema of the calf the skin is shiny without hair growth.    Imaging: No results found.   Labs: No results found for: HGBA1C, ESRSEDRATE, CRP, LABURIC, REPTSTATUS, GRAMSTAIN, CULT, LABORGA   Lab Results  Component Value Date   ALBUMIN 4.2 02/06/2013    No results found for: MG No results found for: VD25OH  No results found for: PREALBUMIN    Latest Ref Rng & Units 02/06/2013    2:20 PM  CBC EXTENDED  WBC 4.0 - 10.5 K/uL 7.0   RBC 3.87 - 5.11 MIL/uL 4.40   Hemoglobin 12.0 - 15.0 g/dL 86.1   HCT 63.9 - 53.9 % 39.6    Platelets 150 - 400 K/uL 160      There is no height or weight on file to calculate BMI.  Orders:  No orders of the defined types were placed in this encounter.  No orders of the defined types were placed in this encounter.    Procedures: No procedures performed  Clinical Data: No additional findings.  ROS:  All other systems negative, except as noted in the HPI. Review of Systems  Objective: Vital Signs: There were no vitals taken for this visit.  Specialty Comments:  No specialty comments available.  PMFS History: Patient Active Problem List   Diagnosis Date Noted   Post herpetic neuralgia 01/31/2023   Anemia of unknown etiology 10/13/2022   Posterior neck pain 11/01/2021   Blocked Eustachian tube, left 09/16/2021   Prediabetes 07/29/2021   History of Guillain-Barre syndrome 07/27/2021   PONV (postoperative nausea and vomiting)    Medical history non-contributory    Chronic rhinitis 10/31/2019   Compression fracture of T9 vertebra (HCC) 11/21/2017   Coronary artery disease involving native coronary artery of native heart with angina pectoris (HCC) 07/05/2017   Abnormal EKG 06/15/2017   Chest pain, atypical 06/15/2017   Senile purpura (HCC) 01/25/2016   Otalgia, left 10/15/2015   Hoarseness 09/22/2015  Essential hypertension 06/25/2015   Hyperlipidemia 06/23/2015   Hyperthyroidism 06/23/2015   Multiple thyroid nodules 06/23/2015   Osteoarthritis 06/23/2015   Osteopenia 06/23/2015   Right bundle branch block 06/23/2015   Thrombocytopenia (HCC) 06/23/2015   Vitamin B12 deficiency 06/23/2015   Age-related osteoporosis with current pathological fracture with routine healing 06/23/2015   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 Would like to try 40 mg for one month. Will call with results.  Electronically signed by: Odella Earnie Duverney, PA-C 10/15/15 [phone]   Hyperlipidemia 06/23/2015   Hyperthyroidism 06/23/2015   Overview:  Subclinical.  Using atenolol.   Medical history non-contributory    Multiple thyroid nodules 06/23/2015   Overview:  Biopsy 2014.     Nonspecific abnormal findings on radiological and examination of skull and head 07/09/2013   Osteoarthritis 06/23/2015   Osteopenia 06/23/2015   PONV (postoperative nausea and vomiting)    Right bundle branch block 06/23/2015   Senile purpura (HCC) 01/25/2016   Spinal stenosis of lumbar region 02/08/2013   Spinal stenosis, lumbar region, with neurogenic claudication 02/08/2013   Thrombocytopenia (HCC) 06/23/2015   Trigeminal neuralgia 07/09/2013   Vitamin B12 deficiency 06/23/2015    Family History  Problem Relation Age of Onset   Colon cancer Brother    Stroke Brother    Parkinson's disease Brother    Heart attack Mother    Angina Mother    Stroke Father     Past Surgical History:  Procedure Laterality Date   BACK SURGERY     1983   BACK SURGERY  11/2017   Put cement in 2 places in back due to broken bones.   CATARACT EXTRACTION W/ INTRAOCULAR LENS  IMPLANT, BILATERAL     LUMBAR LAMINECTOMY Right 02/08/2013   Procedure: Right L3-4, L4-5 Hemi-laminectomy, decompression right L3,L4 and L5 nerve roots;  Surgeon: Lynwood FORBES Better, MD;  Location: [facility] OR;  Service: Orthopedics;  Laterality: Right;   VERTEBROPLASTY     Social History   Occupational History   Not on file  Tobacco Use   Smoking status: Never   Smokeless tobacco: Never  Vaping Use   Vaping status:  Never Used  Substance and Sexual Activity   Alcohol use: No   Drug use: No   Sexual activity: Not on file

## 2023-11-27 DIAGNOSIS — K4090 Unilateral inguinal hernia, without obstruction or gangrene, not specified as recurrent: Secondary | ICD-10-CM | POA: Insufficient documentation

## 2023-11-27 DIAGNOSIS — R5383 Other fatigue: Secondary | ICD-10-CM | POA: Insufficient documentation

## 2024-03-11 ENCOUNTER — Encounter: Payer: Self-pay | Admitting: Radiology

## 2024-04-02 DIAGNOSIS — R634 Abnormal weight loss: Secondary | ICD-10-CM | POA: Insufficient documentation

## 2024-05-20 DIAGNOSIS — R600 Localized edema: Secondary | ICD-10-CM | POA: Insufficient documentation

## 2024-06-11 ENCOUNTER — Ambulatory Visit: Admitting: Cardiology

## 2024-06-11 DIAGNOSIS — E782 Mixed hyperlipidemia: Secondary | ICD-10-CM

## 2024-06-11 DIAGNOSIS — I4510 Unspecified right bundle-branch block: Secondary | ICD-10-CM

## 2024-06-11 DIAGNOSIS — I25119 Atherosclerotic heart disease of native coronary artery with unspecified angina pectoris: Secondary | ICD-10-CM

## 2024-06-11 DIAGNOSIS — I10 Essential (primary) hypertension: Secondary | ICD-10-CM

## 2024-08-05 ENCOUNTER — Ambulatory Visit: Admitting: Cardiology
